# Patient Record
Sex: Female | Born: 1995 | Race: White | Hispanic: No | Marital: Married | Smoking: Never smoker
Health system: Southern US, Community
[De-identification: ages and names within clinical notes are randomized; demographics above are authoritative.]

## PROBLEM LIST (undated history)

## (undated) DIAGNOSIS — F329 Major depressive disorder, single episode, unspecified: Secondary | ICD-10-CM

## (undated) DIAGNOSIS — K219 Gastro-esophageal reflux disease without esophagitis: Secondary | ICD-10-CM

## (undated) DIAGNOSIS — F32A Depression, unspecified: Secondary | ICD-10-CM

## (undated) DIAGNOSIS — F419 Anxiety disorder, unspecified: Secondary | ICD-10-CM

## (undated) DIAGNOSIS — R630 Anorexia: Secondary | ICD-10-CM

## (undated) DIAGNOSIS — E282 Polycystic ovarian syndrome: Secondary | ICD-10-CM

## (undated) HISTORY — DX: Depression, unspecified: F32.A

## (undated) HISTORY — DX: Anxiety disorder, unspecified: F41.9

## (undated) HISTORY — DX: Gastro-esophageal reflux disease without esophagitis: K21.9

## (undated) HISTORY — DX: Anorexia: R63.0

---
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
## 2014-01-13 ENCOUNTER — Encounter: Payer: Self-pay | Admitting: Nurse Practitioner

## 2014-01-13 ENCOUNTER — Ambulatory Visit (INDEPENDENT_AMBULATORY_CARE_PROVIDER_SITE_OTHER): Payer: 59 | Admitting: Nurse Practitioner

## 2014-01-13 VITALS — BP 110/78 | HR 70 | Temp 98.7°F | Ht 66.75 in | Wt 138.0 lb

## 2014-01-13 DIAGNOSIS — Z23 Encounter for immunization: Secondary | ICD-10-CM

## 2014-01-13 DIAGNOSIS — Z0000 Encounter for general adult medical examination without abnormal findings: Secondary | ICD-10-CM

## 2014-01-13 MED ORDER — NORGESTIMATE-ETH ESTRADIOL 0.25-35 MG-MCG PO TABS: 1.0000 | ORAL_TABLET | Freq: Every day | ORAL | Status: DC

## 2014-01-16 ENCOUNTER — Encounter: Payer: Self-pay | Admitting: Nurse Practitioner

## 2014-01-16 NOTE — Progress Notes (Signed)
   Subjective:    Patient ID: Rachel Logan, female    DOB: 11-26-1995, 18 y.o.   MRN: [id number]  HPI presents for her wellness exam. Same sexual partner for the past 2 years. Had a normal menstrual cycle about 2 weeks ago. Stopped her birth control pills in the middle of last pack of a 3 month pack 2 weeks ago. Has had unprotected sex. Regular vision and dental exams. Currently in school. Limited activity. Has not done well with her diet lately. Also upper abdominal pain over the past month, mainly occurs at night, rarely during the day. No heartburn or reflux symptoms. Pain is 5 out of 10 on a pain scale. Has not identified any specific triggers.    Review of Systems  Constitutional: Negative for fever, activity change, appetite change and fatigue.  HENT: Negative for dental problem, ear pain, sinus pressure and sore throat.   Respiratory: Negative for cough, chest tightness, shortness of breath and wheezing.   Cardiovascular: Negative for chest pain.  Gastrointestinal: Positive for abdominal pain. Negative for nausea, vomiting, diarrhea, constipation and abdominal distention.  Genitourinary: Negative for dysuria, urgency, frequency, vaginal discharge, enuresis, difficulty urinating, genital sores, menstrual problem and pelvic pain.       Objective:   Physical Exam  Constitutional: She is oriented to person, place, and time. She appears well-developed. No distress.  HENT:  Right Ear: External ear normal.  Left Ear: External ear normal.  Mouth/Throat: Oropharynx is clear and moist.  Neck: Normal range of motion. Neck supple. No tracheal deviation present. No thyromegaly present.  Cardiovascular: Normal rate, regular rhythm and normal heart sounds.  Exam reveals no gallop.   No murmur heard. Pulmonary/Chest: Effort normal and breath sounds normal.  Abdominal: Soft. She exhibits no distension and no mass. There is no tenderness. There is no rebound and no guarding.  Genitourinary:    Defers GU exam, denies any problems. Had complete STD workup in June which was negative. Defers further workup at this time.  Musculoskeletal: She exhibits no edema.  Lymphadenopathy:    She has no cervical adenopathy.  Neurological: She is alert and oriented to person, place, and time.  Skin: Skin is warm and dry. No rash noted.  Psychiatric: She has a normal mood and affect. Her behavior is normal.  Vitals reviewed. Breast exam: Dense tissue with fine nodularity, no dominant masses. Axilla no adenopathy.        Assessment & Plan:  Routine general medical examination at a health care facility  Need for prophylactic vaccination and inoculation against influenza - Plan: Flu Vaccine QUAD 36+ mos PF IM (Fluarix Quad PF)    Meds ordered this encounter  Medications  . norgestimate-ethinyl estradiol (ORTHO-CYCLEN,SPRINTEC,PREVIFEM) 0.25-35 MG-MCG tablet    Sig: Take 1 tablet by mouth daily.    Dispense:  1 Package    Refill:  11    Order Specific Question:  Supervising Provider    Answer:  Maggie Font   Start control pills first Sunday after her next normal cycle begins. Recommend using condoms as backup method until that time. If no cycle within the next 3 weeks, patient to call for pregnancy testing. Recommend healthy diet and regular activity. Discussed safe sex issues. Recommend trial of OTC med for possible reflux, call back if abdominal symptoms persist. Return in about 1 year (around 01/14/2015).

## 2014-08-06 HISTORY — PX: OTHER SURGICAL HISTORY: SHX169

## 2014-08-20 ENCOUNTER — Encounter: Payer: Self-pay | Admitting: Nurse Practitioner

## 2014-08-20 ENCOUNTER — Ambulatory Visit (INDEPENDENT_AMBULATORY_CARE_PROVIDER_SITE_OTHER): Payer: BC Managed Care – PPO | Admitting: Nurse Practitioner

## 2014-08-20 VITALS — BP 112/80 | Ht 67.0 in | Wt 143.0 lb

## 2014-08-20 DIAGNOSIS — N912 Amenorrhea, unspecified: Secondary | ICD-10-CM

## 2014-08-20 DIAGNOSIS — Z113 Encounter for screening for infections with a predominantly sexual mode of transmission: Secondary | ICD-10-CM | POA: Diagnosis not present

## 2014-08-20 DIAGNOSIS — Z3009 Encounter for other general counseling and advice on contraception: Secondary | ICD-10-CM | POA: Diagnosis not present

## 2014-08-20 DIAGNOSIS — K2100 Gastro-esophageal reflux disease with esophagitis, without bleeding: Secondary | ICD-10-CM

## 2014-08-20 MED ORDER — NORETHINDRONE ACET-ETHINYL EST 1-20 MG-MCG PO TABS: 1.0000 | ORAL_TABLET | Freq: Every day | ORAL | Status: DC

## 2014-08-20 MED ORDER — OMEPRAZOLE 40 MG PO CPDR: 40.0000 mg | DELAYED_RELEASE_CAPSULE | Freq: Every day | ORAL | Status: DC

## 2014-08-20 NOTE — Patient Instructions (Signed)

## 2014-08-22 ENCOUNTER — Encounter: Payer: Self-pay | Admitting: Nurse Practitioner

## 2014-08-22 DIAGNOSIS — K2100 Gastro-esophageal reflux disease with esophagitis, without bleeding: Secondary | ICD-10-CM | POA: Insufficient documentation

## 2014-08-22 LAB — GC/CHLAMYDIA PROBE AMP
Chlamydia trachomatis, NAA: NEGATIVE
Neisseria gonorrhoeae by PCR: NEGATIVE

## 2014-08-22 LAB — POCT URINE PREGNANCY: Preg Test, Ur: NEGATIVE

## 2014-08-22 NOTE — Progress Notes (Signed)
Subjective:  Presents with complaints of burning in the lower area of the chest no the sternum over the past week. Localized. Worse after eating and drinking, unassociated with any particular foods. No shortness of breath. Unassociated with activity. Nonsmoker. No alcohol use. No excessive NSAID use. Has tried Aleve for the pain with no relief. No nausea vomiting. Had diarrhea about a week ago which has resolved. No constipation. No blood in her stools. No change in the color of her stools. No abdominal pain. No fever. Also complaints of no cycle since the end of January. Stop her birth control by mouth around late November. Last intercourse was about 3 weeks ago. Had a negative pregnancy test at home. Was seen for plastic surgery about a week ago, pregnancy test there was also negative.  Objective:   BP 112/80 mmHg  Ht 5\' 7"  (1.702 m)  Wt 143 lb (64.864 kg)  BMI 22.39 kg/m2  LMP 04/11/2014 NAD. Alert, oriented. Lungs clear. Heart regular rate rhythm. Abdomen soft nondistended with mild epigastric area discomfort on exam. No rebound or guarding. No obvious masses. Urine hCG negative.    Assessment:  Problem List Items Addressed This Visit      Digestive   Gastroesophageal reflux disease with esophagitis - Primary    Other Visit Diagnoses    Amenorrhea        Relevant Orders    POCT urine pregnancy    Encounter for other general counseling or advice on contraception        Screen for STD (sexually transmitted disease)        Relevant Orders    GC/Chlamydia Probe Amp      Plan: Discussed contraceptive options. Patient wishes to try a different low-dose birth control pill. Had weight gain on previous pill. Discussed safe sex issues. Routine screening for gonorrhea and Chlamydia. Start omeprazole as directed daily. Call back in 2 weeks if symptoms persist. Given information on dietary measures affecting reflux. Return in about 1 month (around 09/19/2014) for recheck on reflux.

## 2014-09-18 ENCOUNTER — Ambulatory Visit (INDEPENDENT_AMBULATORY_CARE_PROVIDER_SITE_OTHER): Payer: BC Managed Care – PPO | Admitting: Nurse Practitioner

## 2014-09-18 ENCOUNTER — Encounter: Payer: Self-pay | Admitting: Nurse Practitioner

## 2014-09-18 VITALS — BP 120/78 | Ht 67.0 in | Wt 145.8 lb

## 2014-09-18 DIAGNOSIS — K2100 Gastro-esophageal reflux disease with esophagitis, without bleeding: Secondary | ICD-10-CM

## 2014-09-19 ENCOUNTER — Encounter: Payer: Self-pay | Admitting: Nurse Practitioner

## 2014-09-19 NOTE — Progress Notes (Signed)
Subjective:  Presents for recheck on her acid reflux. Took omeprazole 40 mg 1 dose, symptoms have now resolved. Bowels normal limit. Had a normal menstrual cycle at the end of June. Went on vacation and left her birth control pills at home, was 1 week late restarting these. Has not had intercourse for several weeks due to labioplasty. Did not have intercourse during the time she was off her pills.  Objective:   BP 120/78 mmHg  Ht 5\' 7"  (1.702 m)  Wt 145 lb 12.8 oz (66.134 kg)  BMI 22.83 kg/m2  LMP 04/11/2014 NAD. Alert, oriented. Lungs clear. Heart regular rate rhythm. Abdomen soft nontender. External GU well-healed, no signs of infection.  Assessment:  Problem List Items Addressed This Visit      Digestive   RESOLVED: Gastroesophageal reflux disease with esophagitis - Primary     Plan: Use omeprazole on a when necessary basis. Patient understands that she may have breakthrough bleeding or irregular cycles until she is regulated again with birth control pills. Call back if any problems.

## 2015-03-13 ENCOUNTER — Encounter: Payer: Self-pay | Admitting: Family Medicine

## 2015-03-13 ENCOUNTER — Ambulatory Visit (INDEPENDENT_AMBULATORY_CARE_PROVIDER_SITE_OTHER): Payer: BLUE CROSS/BLUE SHIELD | Admitting: Family Medicine

## 2015-03-13 VITALS — BP 118/70 | Temp 98.3°F | Ht 67.0 in | Wt 148.0 lb

## 2015-03-13 DIAGNOSIS — J329 Chronic sinusitis, unspecified: Secondary | ICD-10-CM | POA: Diagnosis not present

## 2015-03-13 MED ORDER — CEFPROZIL 500 MG PO TABS: 500.0000 mg | ORAL_TABLET | Freq: Two times a day (BID) | ORAL | Status: DC

## 2015-03-13 NOTE — Progress Notes (Signed)
   Subjective:    Patient ID: Rachel Logan, female    DOB: [birth year], 20 y.o.   MRN: [id number]  Sore Throat  This is a new problem. Episode onset: 5 days ago. Associated symptoms include coughing, ear pain and headaches. Associated symptoms comments: Runny nose. Treatments tried: mucinex, dayquil, nyquil.   Ha fever and chills  achey all over stuck six days ago  Tried dayquil then nyquil waking up  Some prod   Non smker  Blood gunky   Some exposures, [place] jewelry Left frontal   Review of Systems  HENT: Positive for ear pain.   Respiratory: Positive for cough.   Neurological: Positive for headaches.       Objective:   Physical Exam Alert mild malaise. Hydration good. Vitals stable. HEENT frontal maxillary tenderness pharynx normal neck supple lungs bronchial cough heart regular in rhythm.       Assessment & Plan:  Impression rhinosinusitis likely post viral, discussed with patient. plan antibiotics prescribed. Questions answered. Symptomatic care discussed. warning signs discussed. WSL

## 2015-11-04 ENCOUNTER — Encounter: Payer: Self-pay | Admitting: Family Medicine

## 2015-11-04 ENCOUNTER — Ambulatory Visit (INDEPENDENT_AMBULATORY_CARE_PROVIDER_SITE_OTHER): Payer: BLUE CROSS/BLUE SHIELD | Admitting: Family Medicine

## 2015-11-04 VITALS — BP 110/76 | Temp 98.1°F | Ht 67.0 in | Wt 148.1 lb

## 2015-11-04 DIAGNOSIS — K208 Other esophagitis without bleeding: Secondary | ICD-10-CM

## 2015-11-04 MED ORDER — OMEPRAZOLE 40 MG PO CPDR: 40.0000 mg | DELAYED_RELEASE_CAPSULE | Freq: Every day | ORAL | 1 refills | Status: DC

## 2015-11-04 NOTE — Progress Notes (Signed)
   Subjective:    Patient ID: Rachel Logan, female    DOB: 12/20/1995, 20 y.o.   MRN: [id number]  Gastroesophageal Reflux  She complains of chest pain and heartburn. She reports no abdominal pain, no coughing or no nausea. This is a recurrent problem. The symptoms are aggravated by lying down and certain foods. Pertinent negatives include no fatigue.   Patient states no other concerns this visit.  Patient relates burning when she swallows pain and discomfort when she swallows been present over the past 3-4 days she denies any excessive use of caffeine chocolates or tomato based products. She denies any abdominal pain. No regurgitation in the back of her mouth. She does relate though that she's been on doxycycline over the past several weeks takes it near bedtime without food and only a small amount of water in just recently started noticing this problem  Review of Systems  Constitutional: Negative for fatigue and fever.  HENT: Negative for congestion.   Respiratory: Negative for cough and shortness of breath.   Cardiovascular: Positive for chest pain.  Gastrointestinal: Positive for heartburn. Negative for abdominal pain and nausea.       Objective:   Physical Exam  Constitutional: She appears well-nourished. No distress.  HENT:  Head: Normocephalic.  Cardiovascular: Normal rate, regular rhythm and normal heart sounds.   No murmur heard. Pulmonary/Chest: Effort normal and breath sounds normal.  Abdominal: Soft. She exhibits no distension. There is no tenderness. There is no rebound.  Musculoskeletal: She exhibits no edema.  Lymphadenopathy:    She has no cervical adenopathy.  Neurological: She is alert.  Psychiatric: Her behavior is normal.  Vitals reviewed.   Pathophysiology of doxycycline pill esophagitis was discussed in detail      Assessment & Plan:  Pill esophagitis-proper way to take medicine discuss the next several weeks I would hold off on the doxycycline. I  would recommend taking acid blocker avoiding any foods that trigger acid reflux if ongoing troubles to notify us. The symptoms should get better over the next 2 weeks then after that over the course of the next 3-4 weeks stay off the doxycycline then can reinitiate with food and a tall glass of water in if it still triggers a problem again she should stop this medicine and discuss with dermatology to get the medicine change she should also follow-up with [place] if any ongoing troubles

## 2016-01-26 ENCOUNTER — Encounter: Payer: Self-pay | Admitting: Nurse Practitioner

## 2016-01-26 ENCOUNTER — Ambulatory Visit (INDEPENDENT_AMBULATORY_CARE_PROVIDER_SITE_OTHER): Payer: BLUE CROSS/BLUE SHIELD | Admitting: Nurse Practitioner

## 2016-01-26 VITALS — BP 132/70 | Wt 148.4 lb

## 2016-01-26 DIAGNOSIS — R102 Pelvic and perineal pain: Secondary | ICD-10-CM | POA: Diagnosis not present

## 2016-01-26 DIAGNOSIS — N912 Amenorrhea, unspecified: Secondary | ICD-10-CM

## 2016-01-26 DIAGNOSIS — Z113 Encounter for screening for infections with a predominantly sexual mode of transmission: Secondary | ICD-10-CM | POA: Diagnosis not present

## 2016-01-26 DIAGNOSIS — B373 Candidiasis of vulva and vagina: Secondary | ICD-10-CM | POA: Diagnosis not present

## 2016-01-26 DIAGNOSIS — B3731 Acute candidiasis of vulva and vagina: Secondary | ICD-10-CM

## 2016-01-26 MED ORDER — NORGESTIMATE-ETH ESTRADIOL 0.25-35 MG-MCG PO TABS: 1.0000 | ORAL_TABLET | Freq: Every day | ORAL | 11 refills | Status: DC

## 2016-01-26 MED ORDER — FLUCONAZOLE 150 MG PO TABS: ORAL_TABLET | ORAL | 0 refills | Status: DC

## 2016-01-27 LAB — HCG, SERUM, QUALITATIVE: hCG,Beta Subunit,Qual,Serum: NEGATIVE m[IU]/mL (ref <6)

## 2016-01-27 LAB — CHLAMYDIA/GONOCOCCUS/TRICHOMONAS, NAA
Chlamydia by NAA: NEGATIVE
Gonococcus by NAA: NEGATIVE
Trich vag by NAA: NEGATIVE

## 2016-01-28 ENCOUNTER — Encounter: Payer: Self-pay | Admitting: Nurse Practitioner

## 2016-01-28 NOTE — Progress Notes (Signed)
Subjective:  Presents for c/o no menses since 11/06/15. Has been off birth control pills for several months. Had decreased sex drive and weight gain. Does not use condoms, uses withdrawal method. Has taken 2 pregnancy tests which were negative. Last intercourse late October. Increased discharge at times mainly white to clear. Slight color only once. No fever, pelvic pain, odor, itching or dyspareunia. No urinary symptoms.   Objective:   BP 132/70   Wt 148 lb 6.4 oz (67.3 kg)   BMI 23.24 kg/m  NAD. Alert, oriented. Lungs clear. Heart RRR. Abdomen soft, non tender. External GU: no rashes or lesions. Vagina: small amount thick mucoid discharge. No CMT. Bimanual exam: no tenderness or obvious masses. Wet prep: ph 4.5.  Assessment: Pelvic pain - Plan: Chlamydia/Gonococcus/Trichomonas, NAA  Screen for STD (sexually transmitted disease) - Plan: Chlamydia/Gonococcus/Trichomonas, NAA  Amenorrhea - Plan: hCG, serum, qualitative  Vaginal candidiasis   Plan:  Meds ordered this encounter  Medications  . norgestimate-ethinyl estradiol (ORTHO-CYCLEN,SPRINTEC,PREVIFEM) 0.25-35 MG-MCG tablet    Sig: Take 1 tablet by mouth daily.    Dispense:  1 Package    Refill:  11    Order Specific Question:   Supervising Provider    Answer:   Mikey Kirschner [[id number]]  . fluconazole (DIFLUCAN) 150 MG tablet    Sig: One po qd prn yeast infection; may repeat in 3-4 days if needed    Dispense:  2 tablet    Refill:  0    Order Specific Question:   Supervising Provider    Answer:   Mikey Kirschner [[id number]]   Serum Hcg neg. Start new pill this Sunday; use back up method first pack. Discussed safe sex issues. Has completed HPV vaccine series.  Return if symptoms worsen or fail to improve.

## 2016-12-28 ENCOUNTER — Ambulatory Visit (INDEPENDENT_AMBULATORY_CARE_PROVIDER_SITE_OTHER): Payer: Commercial Managed Care - PPO | Admitting: Nurse Practitioner

## 2016-12-28 ENCOUNTER — Encounter: Payer: Self-pay | Admitting: Nurse Practitioner

## 2016-12-28 VITALS — BP 110/82 | Temp 97.9°F | Ht 67.0 in | Wt 146.5 lb

## 2016-12-28 DIAGNOSIS — B9689 Other specified bacterial agents as the cause of diseases classified elsewhere: Secondary | ICD-10-CM

## 2016-12-28 DIAGNOSIS — J069 Acute upper respiratory infection, unspecified: Secondary | ICD-10-CM | POA: Diagnosis not present

## 2016-12-28 DIAGNOSIS — J029 Acute pharyngitis, unspecified: Secondary | ICD-10-CM

## 2016-12-28 LAB — POCT RAPID STREP A (OFFICE): Rapid Strep A Screen: NEGATIVE

## 2016-12-28 MED ORDER — AZITHROMYCIN 250 MG PO TABS: ORAL_TABLET | ORAL | 0 refills | Status: DC

## 2016-12-28 NOTE — Progress Notes (Signed)
Subjective: Presents for complaints of sore throat and ear pain for the past 3 weeks.  No fever.  No headache.  Producing dark green nasal drainage, slight bleeding at times.  Cough worse at nighttime.  No wheezing.  Left ear pain.  Objective:   BP 110/82   Temp 97.9 F (36.6 C) (Oral)   Ht 5\' 7"  (1.702 m)   Wt 146 lb 8 oz (66.5 kg)   BMI 22.95 kg/m  NAD.  Alert, oriented.  TMs retracted bilaterally, no erythema.  Pharynx injected with green PND noted.  Neck supple with mild soft anterior adenopathy.  Lungs clear.  Heart regular rate rhythm. Results for orders placed or performed in visit on 12/28/16  POCT rapid strep A  Result Value Ref Range   Rapid Strep A Screen Negative Negative     Assessment:  Bacterial upper respiratory infection  Sore throat - Plan: POCT rapid strep A, CANCELED: Strep A DNA probe    Plan:   Meds ordered this encounter  Medications  . tretinoin (RETIN-A) 0.05 % cream    Sig: APPLY TO THE AFFECTED AREA ON THE FACE AT BEDTIME.    Refill:  1  . azithromycin (ZITHROMAX Z-PAK) 250 MG tablet    Sig: Take 2 tablets (500 mg) on  Day 1,  followed by 1 tablet (250 mg) once daily on Days 2 through 5.    Dispense:  6 each    Refill:  0    Order Specific Question:   Supervising Provider    Answer:   Mikey Kirschner [[id number]]   OTC meds as directed for congestion and cough.  Call back next week if no improvement, sooner if worse.  Warning signs were reviewed.

## 2017-01-18 ENCOUNTER — Ambulatory Visit (INDEPENDENT_AMBULATORY_CARE_PROVIDER_SITE_OTHER): Payer: Commercial Managed Care - PPO | Admitting: Nurse Practitioner

## 2017-01-18 ENCOUNTER — Encounter: Payer: Self-pay | Admitting: Nurse Practitioner

## 2017-01-18 DIAGNOSIS — Z23 Encounter for immunization: Secondary | ICD-10-CM | POA: Diagnosis not present

## 2017-01-18 DIAGNOSIS — F329 Major depressive disorder, single episode, unspecified: Secondary | ICD-10-CM | POA: Diagnosis not present

## 2017-01-18 DIAGNOSIS — F419 Anxiety disorder, unspecified: Secondary | ICD-10-CM | POA: Insufficient documentation

## 2017-01-18 DIAGNOSIS — Z111 Encounter for screening for respiratory tuberculosis: Secondary | ICD-10-CM | POA: Diagnosis not present

## 2017-01-18 MED ORDER — ESCITALOPRAM OXALATE 10 MG PO TABS: 10.0000 mg | ORAL_TABLET | Freq: Every day | ORAL | 2 refills | Status: DC

## 2017-01-18 NOTE — Progress Notes (Signed)
Subjective: Presents to discuss her anxiety and depression symptoms.  Overall sleeping well at nighttime.  Denies any suicidal or homicidal thoughts or ideation.  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  Would like to avoid using controlled substances. Depression screen PHQ 2/9 01/18/2017  Decreased Interest 2  Down, Depressed, Hopeless 1  PHQ - 2 Score 3  Altered sleeping 3  Tired, decreased energy 3  Change in appetite 2  Feeling bad or failure about yourself  2  Trouble concentrating 0  Moving slowly or fidgety/restless 0  Suicidal thoughts 0  PHQ-9 Score 13  Difficult doing work/chores Somewhat difficult   GAD 7 : Generalized Anxiety Score 01/18/2017  Nervous, Anxious, on Edge 3  Control/stop worrying 3  Worry too much - different things 3  Trouble relaxing 2  Restless 1  Easily annoyed or irritable 3  Afraid - awful might happen 0  Total GAD 7 Score 15  Anxiety Difficulty Somewhat difficult      Objective:   There were no vitals taken for this visit. NAD.  Alert, oriented.  Thoughts logical coherent and relevant.  Dressed appropriately.  Making good eye contact.  Lungs clear.  Heart regular rate and rhythm.  Assessment:  Anxiety and depression  Screening for tuberculosis - Plan: TB Skin Test  Need for vaccination - Plan: Flu Vaccine QUAD 6+ mos PF IM (Fluarix Quad PF)    Plan:   Meds ordered this encounter  Medications  . escitalopram (LEXAPRO) 10 MG tablet    Sig: Take 1 tablet (10 mg total) daily by mouth.    Dispense:  30 tablet    Refill:  2    Order Specific Question:   Supervising Provider    Answer:   Mikey Kirschner [[id number]]   TB skin test today per school requirement.  Start Lexapro 10 mg 1 p.o. daily.  Reviewed potential adverse effects.  DC med and call if any problems.  Otherwise follow-up first week of December for her wellness exam and recheck on Lexapro use.  Call back sooner if any problems.

## 2017-01-20 LAB — TB SKIN TEST
Induration: 0 mm
TB Skin Test: NEGATIVE

## 2017-02-07 ENCOUNTER — Ambulatory Visit (INDEPENDENT_AMBULATORY_CARE_PROVIDER_SITE_OTHER): Payer: Commercial Managed Care - PPO | Admitting: Nurse Practitioner

## 2017-02-07 ENCOUNTER — Encounter: Payer: Self-pay | Admitting: Nurse Practitioner

## 2017-02-07 VITALS — BP 112/74 | Ht 67.0 in | Wt 148.8 lb

## 2017-02-07 DIAGNOSIS — Z1151 Encounter for screening for human papillomavirus (HPV): Secondary | ICD-10-CM | POA: Diagnosis not present

## 2017-02-07 DIAGNOSIS — R3915 Urgency of urination: Secondary | ICD-10-CM

## 2017-02-07 DIAGNOSIS — Z111 Encounter for screening for respiratory tuberculosis: Secondary | ICD-10-CM

## 2017-02-07 DIAGNOSIS — Z01419 Encounter for gynecological examination (general) (routine) without abnormal findings: Secondary | ICD-10-CM | POA: Diagnosis not present

## 2017-02-07 DIAGNOSIS — Z124 Encounter for screening for malignant neoplasm of cervix: Secondary | ICD-10-CM | POA: Diagnosis not present

## 2017-02-07 LAB — POCT URINALYSIS DIPSTICK
Spec Grav, UA: 1.02 (ref 1.010–1.025)
pH, UA: 6 (ref 5.0–8.0)

## 2017-02-07 NOTE — Progress Notes (Signed)
Subjective:    Patient ID: Rachel Logan, female    DOB: 05/19/1995, 21 y.o.   MRN: [id number]  HPI presents for her wellness exam. Not sexually active at this time. Is with the same partner but decided to not have a sexual relationship for religious reasons. Off contraceptive. Mostly regular cycles, normal flow. Regular vision exams. Needs dental care. Was very active working out at one point but not for the past 2 months due to school and work. Has taken on more at work to make money this month. 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 Lexapro has helped some but having overwhelming stress this month. Denies any adverse effects. No suicidal or homicidal thoughts or ideation.     Review of Systems  Constitutional: Positive for fatigue. Negative for activity change and appetite change.  HENT: Negative for dental problem, ear pain, sinus pressure and sore throat.   Respiratory: Negative for cough, chest tightness, shortness of breath and wheezing.   Cardiovascular: Negative for chest pain.  Gastrointestinal: Negative for abdominal distention, abdominal pain, constipation, diarrhea, nausea and vomiting.  Genitourinary: Positive for dyspareunia and urgency. Negative for difficulty urinating, dysuria, enuresis, frequency, genital sores, menstrual problem, pelvic pain and vaginal discharge.       Having urinary urgency. No incontinence. Also was having occasional pain during intercourse.   Psychiatric/Behavioral: Positive for sleep disturbance. Negative for suicidal ideas. The patient is nervous/anxious.        Objective:   Physical Exam  Constitutional: She is oriented to person, place, and time. She appears well-developed. No distress.  HENT:  Right Ear: External ear normal.  Left Ear: External ear normal.  Mouth/Throat: Oropharynx is clear and moist.  Neck: Normal range of motion. Neck supple. No tracheal deviation present. No thyromegaly present.  Cardiovascular:  Normal rate, regular rhythm and normal heart sounds. Exam reveals no gallop.  No murmur heard. Pulmonary/Chest: Effort normal and breath sounds normal. Right breast exhibits no inverted nipple, no mass, no skin change and no tenderness. Left breast exhibits no inverted nipple, no mass, no skin change and no tenderness. Breasts are symmetrical.  Axillae no adenopathy.   Abdominal: Soft. She exhibits no distension. There is no tenderness.  Genitourinary: Vagina normal and uterus normal. No vaginal discharge found.  Genitourinary Comments: External GU: no rashes or lesions noted. Vagina: mild white mucoid discharge. Cervix normal in appearance. No CMT. Bimanual exam: mild tenderness along anterior vaginal wall with speculum insertion. Bimanual exam: no adnexal tenderness or masses.   Musculoskeletal: She exhibits no edema.  Lymphadenopathy:    She has no cervical adenopathy.  Neurological: She is alert and oriented to person, place, and time.  Skin: Skin is warm and dry. No rash noted.  Psychiatric: She has a normal mood and affect. Her behavior is normal.  Vitals reviewed.  Results for orders placed or performed in visit on 02/07/17  POCT urinalysis dipstick  Result Value Ref Range   Color, UA     Clarity, UA     Glucose, UA     Bilirubin, UA     Ketones, UA     Spec Grav, UA 1.020 1.010 - 1.025   Blood, UA     pH, UA 6.0 5.0 - 8.0   Protein, UA     Urobilinogen, UA  0.2 or 1.0 E.U./dL   Nitrite, UA     Leukocytes, UA  Negative           Assessment &  Plan:  Well woman exam - Plan: Pap IG and Chlamydia/Gonococcus, NAA  Urinary urgency - Plan: POCT urinalysis dipstick  Screening for cervical cancer - Plan: Pap IG and Chlamydia/Gonococcus, NAA  Screening for HPV (human papillomavirus) - Plan: Pap IG and Chlamydia/Gonococcus, NAA  Screening-pulmonary TB - Plan: TB Skin Test  Recommend urology referral to evaluate for possible interstitial cystitis. Defers for now. Discussed  importance of stress reduction, healthy diet and resuming exercise.  Return in about 1 year (around 02/07/2018) for physical.

## 2017-02-09 ENCOUNTER — Telehealth: Payer: Self-pay | Admitting: Family Medicine

## 2017-02-09 LAB — PAP IG AND CT-NG NAA
Chlamydia, Nuc. Acid Amp: NEGATIVE
Gonococcus by Nucleic Acid Amp: NEGATIVE
PAP Smear Comment: 0

## 2017-02-09 LAB — TB SKIN TEST
Induration: 0 mm
TB Skin Test: NEGATIVE

## 2017-02-09 NOTE — Telephone Encounter (Signed)
Pt dropped off a physical form to be filled out by [employer]. Form is in nurse box.

## 2017-02-10 NOTE — Telephone Encounter (Signed)
Done and given to front desk.

## 2017-03-30 ENCOUNTER — Ambulatory Visit (INDEPENDENT_AMBULATORY_CARE_PROVIDER_SITE_OTHER): Payer: Commercial Managed Care - PPO | Admitting: Nurse Practitioner

## 2017-03-30 ENCOUNTER — Encounter: Payer: Self-pay | Admitting: Nurse Practitioner

## 2017-03-30 VITALS — BP 112/76 | Ht 67.0 in | Wt 149.1 lb

## 2017-03-30 DIAGNOSIS — F329 Major depressive disorder, single episode, unspecified: Secondary | ICD-10-CM

## 2017-03-30 DIAGNOSIS — F32A Depression, unspecified: Secondary | ICD-10-CM

## 2017-03-30 DIAGNOSIS — F411 Generalized anxiety disorder: Secondary | ICD-10-CM

## 2017-03-30 DIAGNOSIS — F419 Anxiety disorder, unspecified: Secondary | ICD-10-CM

## 2017-03-30 DIAGNOSIS — F430 Acute stress reaction: Secondary | ICD-10-CM

## 2017-03-30 MED ORDER — ESCITALOPRAM OXALATE 20 MG PO TABS: 20.0000 mg | ORAL_TABLET | Freq: Every day | ORAL | 2 refills | Status: DC

## 2017-03-30 MED ORDER — CLONAZEPAM 0.5 MG PO TABS: ORAL_TABLET | ORAL | 0 refills | Status: DC

## 2017-03-30 NOTE — Patient Instructions (Signed)
[facility]  [email or web address]

## 2017-04-01 ENCOUNTER — Encounter: Payer: Self-pay | Admitting: Nurse Practitioner

## 2017-04-01 NOTE — Progress Notes (Signed)
Subjective: Presents for recheck on her anxiety and depression.  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  Now having generalized anxiety with some difficulty such as driving in traffic.  Denies suicidal or homicidal thoughts or ideation. GAD 7 : Generalized Anxiety Score 03/30/2017 01/18/2017  Nervous, Anxious, on Edge 3 3  Control/stop worrying 3 3  Worry too much - different things 3 3  Trouble relaxing 3 2  Restless 3 1  Easily annoyed or irritable 3 3  Afraid - awful might happen 3 0  Total GAD 7 Score 21 15  Anxiety Difficulty Somewhat difficult Somewhat difficult      Objective:   BP 112/76   Ht 5\' 7"  (1.702 m)   Wt 149 lb 0.8 oz (67.6 kg)   BMI 23.34 kg/m  NAD.  Alert, oriented.  Lungs clear.  Heart regular rate and rhythm.  Thoughts logical coherent and relevant.  Crying at times during office visit.  Dressed appropriately.  Making good eye contact.  Assessment:   Problem List Items Addressed This Visit      Other   Anxiety and depression - Primary   Relevant Medications   escitalopram (LEXAPRO) 20 MG tablet    Other Visit Diagnoses    Anxiety as acute reaction to exceptional stress       Relevant Medications   escitalopram (LEXAPRO) 20 MG tablet        Plan:   Meds ordered this encounter  Medications  . escitalopram (LEXAPRO) 20 MG tablet    Sig: Take 1 tablet (20 mg total) by mouth daily.    Dispense:  30 tablet    Refill:  2    Order Specific Question:   Supervising Provider    Answer:   Mikey Kirschner [[id number]]  . clonazePAM (KLONOPIN) 0.5 MG tablet    Sig: Take 1/2-1 tab po BID prn severe anxiety    Dispense:  30 tablet    Refill:  0    Order Specific Question:   Supervising Provider    Answer:   Mikey Kirschner [[id number]]   Increase Lexapro to 20 mg.  Given prescription for Klonopin to use sparingly only for extreme anxiety.  Drowsiness precautions.  Advised patient not to take Klonopin if she becomes pregnant.  Encourage patient  to seek mental health counseling which she defers at this time. Return in about 2 months (around 05/28/2017). Call back sooner if no improvement or worsening symptoms.

## 2017-05-29 ENCOUNTER — Encounter: Payer: Self-pay | Admitting: Nurse Practitioner

## 2017-05-29 ENCOUNTER — Ambulatory Visit (INDEPENDENT_AMBULATORY_CARE_PROVIDER_SITE_OTHER): Payer: Commercial Managed Care - PPO | Admitting: Nurse Practitioner

## 2017-05-29 VITALS — BP 124/76 | Ht 67.0 in | Wt 152.4 lb

## 2017-05-29 DIAGNOSIS — F329 Major depressive disorder, single episode, unspecified: Secondary | ICD-10-CM | POA: Diagnosis not present

## 2017-05-29 DIAGNOSIS — F419 Anxiety disorder, unspecified: Secondary | ICD-10-CM

## 2017-05-29 NOTE — Progress Notes (Signed)
Subjective:  Presents for recheck on anxiety and depression. Doing well overall on Lexapro. Has taken a total of 3 Klonopin with minimal improvement during testing.This is the main area she is still struggling in.  Continues to experience test anxiety. Second guessing herself. 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 Denies suicidal or homicidal thoughts or ideation.   Objective:   BP 124/76   Ht 5\' 7"  (1.702 m)   Wt 152 lb 6.4 oz (69.1 kg)   BMI 23.87 kg/m  NAD. Alert, oriented. Lungs clear. Heart RRR. Thoughts logical, coherent and relevant. Dressed appropriately. Mildly anxious affect. Making good eye contact.   Assessment:   Problem List Items Addressed This Visit      Other   Anxiety and depression - Primary       Plan:  Continue Lexapro as directed. Use Klonopin sparingly. Recommend that she go to counseling center at [facility] for assistance with test taking skills and test anxiety. Patient agrees with this plan. Return in about 3 months (around 08/29/2017) for recheck. Call back sooner if needed.

## 2017-08-28 ENCOUNTER — Other Ambulatory Visit: Payer: Self-pay | Admitting: Nurse Practitioner

## 2017-08-28 ENCOUNTER — Ambulatory Visit (INDEPENDENT_AMBULATORY_CARE_PROVIDER_SITE_OTHER): Payer: Commercial Managed Care - PPO | Admitting: Nurse Practitioner

## 2017-08-28 ENCOUNTER — Encounter: Payer: Self-pay | Admitting: Nurse Practitioner

## 2017-08-28 VITALS — BP 110/72 | Ht 67.0 in | Wt 149.0 lb

## 2017-08-28 DIAGNOSIS — F329 Major depressive disorder, single episode, unspecified: Secondary | ICD-10-CM | POA: Diagnosis not present

## 2017-08-28 DIAGNOSIS — F419 Anxiety disorder, unspecified: Secondary | ICD-10-CM | POA: Diagnosis not present

## 2017-08-28 MED ORDER — BUPROPION HCL ER (XL) 150 MG PO TB24: 150.0000 mg | ORAL_TABLET | Freq: Every day | ORAL | 2 refills | Status: DC

## 2017-08-28 NOTE — Progress Notes (Signed)
Subjective:  Presents for recheck of depression and anxiety. Currently on Lexapro 20 mg daily. Stopped for awhile thinking it may have caused some restless legs but no change so she has been back on medication for about a month. Feeling good today but continue to be under significant stress from work and school. Her main concern is hypersomnia which is common with her depression. Not sexually active and no plans for pregnancy. Main complaint with Lexapro is flat affect. Would like to cut dose in half.  Depression screen [facility] 2/9 08/28/2017 01/18/2017  Decreased Interest 1 2  Down, Depressed, Hopeless 1 1  PHQ - 2 Score 2 3  Altered sleeping 3 3  Tired, decreased energy 2 3  Change in appetite 2 2  Feeling bad or failure about yourself  1 2  Trouble concentrating 1 0  Moving slowly or fidgety/restless 1 0  Suicidal thoughts 0 0  PHQ-9 Score 12 13  Difficult doing work/chores Somewhat difficult Somewhat difficult   GAD 7 : Generalized Anxiety Score 08/28/2017 03/30/2017 01/18/2017  Nervous, Anxious, on Edge 2 3 3   Control/stop worrying 1 3 3   Worry too much - different things 1 3 3   Trouble relaxing 1 3 2   Restless 1 3 1   Easily annoyed or irritable 2 3 3   Afraid - awful might happen 1 3 0  Total GAD 7 Score 9 21 15   Anxiety Difficulty Not difficult at all Somewhat difficult Somewhat difficult      Objective:   BP 110/72   Ht 5\' 7"  (1.702 m)   Wt 149 lb (67.6 kg)   BMI 23.34 kg/m  NAD. Alert, oriented. Lungs clear. Heart RRR.  Thoughts logical coherent and relevant.  Cheerful affect.  Dressed appropriately.  Assessment:   Problem List Items Addressed This Visit      Other   Anxiety and depression - Primary   Relevant Medications   buPROPion (WELLBUTRIN XL) 150 MG 24 hr tablet       Plan:   Meds ordered this encounter  Medications  . buPROPion (WELLBUTRIN XL) 150 MG 24 hr tablet    Sig: Take 1 tablet (150 mg total) by mouth daily.    Dispense:  30 tablet    Refill:  2     Order Specific Question:   Supervising Provider    Answer:   Mikey Kirschner [[id number]]   Discussed options.  Take half of the dose of Lexapro (10 mg) daily.  Trial of Wellbutrin XL to see if this will give her energy and help her depression.  Reviewed potential adverse effects.  DC med and call if any problems.  If no improvement with Wellbutrin, consider switching Lexapro at that time.  Discussed importance of stress reduction and regular activity.

## 2017-08-28 NOTE — Patient Instructions (Signed)
Cut Lexapro dose in 1/2 (10 mg)

## 2017-09-26 ENCOUNTER — Telehealth: Payer: Self-pay | Admitting: Family Medicine

## 2017-09-26 NOTE — Telephone Encounter (Signed)
Pt wants to have a follow up on anxiety with Hoyle Sauer. She only has same day slots left on the schedule. CB# [phone].

## 2017-09-26 NOTE — Telephone Encounter (Signed)
Nurses-this will be up to [place].  Please forward message to her.  Hoyle Sauer has every right to determine this 1 way or the other

## 2017-09-27 NOTE — Telephone Encounter (Signed)
Appointment scheduled.

## 2017-09-27 NOTE — Telephone Encounter (Signed)
Please feel free to put her in a same day slot. Thanks for letting me know.

## 2017-09-29 ENCOUNTER — Encounter: Payer: Self-pay | Admitting: Nurse Practitioner

## 2017-09-29 ENCOUNTER — Ambulatory Visit (INDEPENDENT_AMBULATORY_CARE_PROVIDER_SITE_OTHER): Payer: Commercial Managed Care - PPO | Admitting: Nurse Practitioner

## 2017-09-29 VITALS — BP 126/84 | Ht 67.0 in | Wt 158.0 lb

## 2017-09-29 DIAGNOSIS — F909 Attention-deficit hyperactivity disorder, unspecified type: Secondary | ICD-10-CM

## 2017-09-29 DIAGNOSIS — F419 Anxiety disorder, unspecified: Secondary | ICD-10-CM

## 2017-09-29 DIAGNOSIS — F329 Major depressive disorder, single episode, unspecified: Secondary | ICD-10-CM

## 2017-09-29 DIAGNOSIS — Z79899 Other long term (current) drug therapy: Secondary | ICD-10-CM

## 2017-09-29 MED ORDER — ESCITALOPRAM OXALATE 20 MG PO TABS: 20.0000 mg | ORAL_TABLET | Freq: Every day | ORAL | 5 refills | Status: DC

## 2017-09-29 MED ORDER — AMPHETAMINE-DEXTROAMPHETAMINE 10 MG PO TABS: ORAL_TABLET | ORAL | 0 refills | Status: DC

## 2017-09-30 ENCOUNTER — Encounter: Payer: Self-pay | Admitting: Nurse Practitioner

## 2017-09-30 NOTE — Progress Notes (Signed)
Subjective: Presents for recheck on her anxiety and depression.  Saw no improvement with use of Wellbutrin.  Continues to experience hypersomnia.  Continues to have anxiety related to school.  Has a hard time focusing, distracted easily.  Also hard time sitting still.  Has been passing her exams. See adult ADHD self report checklist.  Did not have any significant problems when she was younger but had a very structured environment which helped.  Objective: BP 126/84   Ht 5\' 7"  (1.702 m)   Wt 158 lb (71.7 kg)   BMI 24.75 kg/m  NAD.  Alert, oriented.  Lungs clear.  Heart regular rate and rhythm.  Thoughts logical coherent and relevant.  Mildly anxious affect.  Making good eye contact.  Dressed appropriately.  EKG normal limit.  Assessment:  Problem List Items Addressed This Visit      Other   Adult ADHD - Primary   Anxiety and depression   Relevant Medications   escitalopram (LEXAPRO) 20 MG tablet   Other Relevant Orders   EKG 12-Lead    Other Visit Diagnoses    High risk medication use       Relevant Orders   EKG 12-Lead       Plan:  Meds ordered this encounter  Medications  . escitalopram (LEXAPRO) 20 MG tablet    Sig: Take 1 tablet (20 mg total) by mouth daily.    Dispense:  30 tablet    Refill:  5    This prescription was filled on 08/28/2017. Any refills authorized will be placed on file.    Order Specific Question:   Supervising Provider    Answer:   Mikey Kirschner [[id number]]  . amphetamine-dextroamphetamine (ADDERALL) 10 MG tablet    Sig: Take one po qam then one 4 hours later    Dispense:  60 tablet    Refill:  0    Order Specific Question:   Supervising Provider    Answer:   Mikey Kirschner [[id number]]   Stop Wellbutrin.  Go back to 20 mg of Lexapro.  Trial of Adderall 10 mg twice daily as directed over the weekend.  Patient to send a MyChart message early next week regarding medication and dosage.  Discussed potential adverse effects of Adderall.  DC medication if any  problems.  If patient continues to struggle with symptoms after this, recommend referral to mental health for evaluation and treatment. Return in about 3 months (around 12/30/2017) for recheck.

## 2017-10-01 ENCOUNTER — Encounter: Payer: Self-pay | Admitting: Nurse Practitioner

## 2017-10-02 ENCOUNTER — Encounter: Payer: Self-pay | Admitting: Nurse Practitioner

## 2017-10-02 ENCOUNTER — Other Ambulatory Visit: Payer: Self-pay | Admitting: Nurse Practitioner

## 2017-10-02 MED ORDER — AMPHETAMINE-DEXTROAMPHETAMINE 15 MG PO TABS: ORAL_TABLET | ORAL | 0 refills | Status: DC

## 2017-10-04 ENCOUNTER — Encounter: Payer: Self-pay | Admitting: Family Medicine

## 2017-11-07 ENCOUNTER — Telehealth: Payer: Self-pay | Admitting: Family Medicine

## 2017-11-07 NOTE — Telephone Encounter (Signed)
Per pharmacy: Patient has script ready to be filled at pharmacy. Patient verbalized understanding.

## 2017-11-07 NOTE — Telephone Encounter (Signed)
Please check script availablity at her pharmacy

## 2017-11-07 NOTE — Telephone Encounter (Signed)
Last visit was August 26th with Hoyle Sauer. Needs refill on amphetamine-dextroamphetamine (ADDERALL) 15 MG tablet

## 2017-11-22 ENCOUNTER — Encounter: Payer: Self-pay | Admitting: Family Medicine

## 2017-11-23 ENCOUNTER — Ambulatory Visit (INDEPENDENT_AMBULATORY_CARE_PROVIDER_SITE_OTHER): Payer: Commercial Managed Care - PPO | Admitting: *Deleted

## 2017-11-23 DIAGNOSIS — Z23 Encounter for immunization: Secondary | ICD-10-CM

## 2018-01-23 ENCOUNTER — Ambulatory Visit: Payer: Commercial Managed Care - PPO

## 2018-01-24 ENCOUNTER — Other Ambulatory Visit: Payer: Self-pay | Admitting: *Deleted

## 2018-01-24 ENCOUNTER — Encounter: Payer: Self-pay | Admitting: Family Medicine

## 2018-01-24 DIAGNOSIS — Z111 Encounter for screening for respiratory tuberculosis: Secondary | ICD-10-CM

## 2018-01-24 NOTE — Telephone Encounter (Signed)
Please order the QuantiFERON gold TB test for this patient and let her know how to go about getting it done

## 2018-02-05 ENCOUNTER — Encounter: Payer: Self-pay | Admitting: Family Medicine

## 2018-02-16 LAB — QUANTIFERON-TB GOLD PLUS
QuantiFERON Mitogen Value: >10 IU/mL
QuantiFERON Nil Value: 0.04 IU/mL
QuantiFERON TB1 Ag Value: 0.07 IU/mL
QuantiFERON TB2 Ag Value: 0.06 IU/mL
QuantiFERON-TB Gold Plus: NEGATIVE

## 2018-02-26 ENCOUNTER — Ambulatory Visit: Payer: Commercial Managed Care - PPO | Admitting: Family Medicine

## 2018-03-20 ENCOUNTER — Ambulatory Visit: Payer: Commercial Managed Care - PPO | Admitting: Family Medicine

## 2018-11-15 ENCOUNTER — Encounter: Payer: Self-pay | Admitting: Family Medicine

## 2018-11-15 ENCOUNTER — Other Ambulatory Visit: Payer: Self-pay

## 2018-11-15 ENCOUNTER — Ambulatory Visit (INDEPENDENT_AMBULATORY_CARE_PROVIDER_SITE_OTHER): Payer: Commercial Managed Care - PPO | Admitting: Family Medicine

## 2018-11-15 VITALS — BP 116/78 | Temp 97.7°F | Ht 67.0 in | Wt 146.6 lb

## 2018-11-15 DIAGNOSIS — Z23 Encounter for immunization: Secondary | ICD-10-CM | POA: Diagnosis not present

## 2018-11-15 DIAGNOSIS — G2581 Restless legs syndrome: Secondary | ICD-10-CM | POA: Diagnosis not present

## 2018-11-15 DIAGNOSIS — Z0000 Encounter for general adult medical examination without abnormal findings: Secondary | ICD-10-CM | POA: Diagnosis not present

## 2018-11-15 MED ORDER — ROPINIROLE HCL 0.5 MG PO TABS: 0.5000 mg | ORAL_TABLET | Freq: Every day | ORAL | 0 refills | Status: DC

## 2018-11-15 NOTE — Progress Notes (Signed)
Subjective:    Patient ID: Rachel Logan, female    DOB: October 20, 1995, 23 y.o.   MRN: [id number]  HPI The patient comes in today for a wellness visit. 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  A review of medications was also completed.  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   Additional concerns: restless leg syndrome; has noticed in the past year. Pt is in standing on feet for long periods of time in clinical and at work. At night her legs both jerk and she has trouble sleeping due to legs jerking.  Pt also would like Tdap and Flu shot.   Review of Systems  Constitutional: Negative for activity change, appetite change and fatigue.  HENT: Negative for congestion and rhinorrhea.   Eyes: Negative for discharge.  Respiratory: Negative for cough, chest tightness and wheezing.   Cardiovascular: Negative for chest pain.  Gastrointestinal: Negative for abdominal pain, blood in stool and vomiting.  Endocrine: Negative for polyphagia.  Genitourinary: Negative for difficulty urinating and frequency.  Musculoskeletal: Negative for neck pain.  Skin: Negative for color change.  Allergic/Immunologic: Negative for environmental allergies and food allergies.  Neurological: Negative  for weakness and headaches.  Psychiatric/Behavioral: Negative for agitation and behavioral problems.       Objective:   Physical Exam Constitutional:      Appearance: She is well-developed.  HENT:     Head: Normocephalic.     Right Ear: External ear normal.     Left Ear: External ear normal.  Eyes:     Pupils: Pupils are equal, round, and reactive to light.  Neck:     Musculoskeletal: Normal range of motion.     Thyroid: No thyromegaly.  Cardiovascular:     Rate and Rhythm: Normal rate and regular rhythm.     Heart sounds: Normal heart sounds. No murmur.  Pulmonary:     Effort: Pulmonary effort is normal. No respiratory distress.     Breath sounds: Normal breath sounds. No wheezing.  Abdominal:     General: Bowel sounds are normal. There is no distension.     Palpations: Abdomen is soft. There is no mass.     Tenderness: There is no abdominal tenderness.  Musculoskeletal: Normal range of motion.        General: No tenderness.  Lymphadenopathy:     Cervical: No cervical adenopathy.  Skin:    General: Skin is warm and dry.  Neurological:     Mental Status: She is alert and oriented to person, place, and time.     Motor: No abnormal muscle tone.  Psychiatric:        Behavior: Behavior normal.           Assessment & Plan:  Adult wellness-complete.wellness  physical was conducted today. Importance of diet and exercise were discussed in detail.  In addition to this a discussion regarding safety was also covered. We also reviewed over immunizations and gave recommendations regarding current immunization needed for age.  In addition to this additional areas were also touched on including: Preventative health exams needed:  Colonoscopy not indicated  Patient was advised yearly wellness exam  It does appear the patient has restless legs we will initiate medication patient will give [place] a MyChart update in several weeks regarding this Upon further discussion patient  decided not to do medicine because she hopes to have children next year she will let us know if she changes her mind  Cholesterol and sugar recommended somewhere within the next 2 to 3 years not necessary right at the moment with the pandemic going on  Patient denies being depressed overall doing well

## 2018-12-26 ENCOUNTER — Telehealth: Payer: Self-pay | Admitting: Family Medicine

## 2018-12-26 DIAGNOSIS — Z111 Encounter for screening for respiratory tuberculosis: Secondary | ICD-10-CM

## 2018-12-26 NOTE — Telephone Encounter (Signed)
Lab orders placed. Left message on pt voicemail

## 2018-12-26 NOTE — Telephone Encounter (Signed)
Pleas order thankl you

## 2018-12-26 NOTE — Telephone Encounter (Signed)
Needs a TB Quantiferon to be ordered at [place].  Needs the test for school.

## 2018-12-26 NOTE — Telephone Encounter (Signed)
Please advise. Thank you

## 2018-12-30 LAB — QUANTIFERON-TB GOLD PLUS
QuantiFERON Mitogen Value: >10 IU/mL
QuantiFERON Nil Value: 0.02 IU/mL
QuantiFERON TB1 Ag Value: 0.02 IU/mL
QuantiFERON TB2 Ag Value: 0.03 IU/mL
QuantiFERON-TB Gold Plus: NEGATIVE

## 2019-02-21 ENCOUNTER — Ambulatory Visit: Payer: Commercial Managed Care - PPO | Admitting: Family Medicine

## 2019-03-08 NOTE — L&D Delivery Note (Signed)
Delivery Note:   G1P0000 at [redacted]w[redacted]d  Admitting diagnosis: Encounter for induction of labor [Z34.90] Risks: Persistent right umbilical vein, excessive weight gain, anxiety Onset of labor: IOL IOL/Augmentation: AROM, Pitocin, Cytotec and OP Foley ROM: 10/11/2019 @ 1810, clear fluid  Complete dilation at 10/11/2019  2145 Onset of pushing at 2145 FHR second stage Cat 1  Analgesia /Anesthesia intrapartum:Local  Pushing in lithotomy position with CNM and L&D staff support, FOB, Lazarus Salines, present for birth and supportive. Janett Billow, doula, present and supportive.   Delivery of a Live born female  Birth Weight:  pending APGAR: 70, 9  Newborn Delivery   Birth date/time: 10/11/2019 21:52:00 Delivery type: Vaginal, Spontaneous      in cephalic presentation, position OA to LOA.  APGAR:1 min-8 , 5 min-9   Nuchal Cord: Yes x1 Cord double clamped after cessation of pulsation, cut by [facility], FOB.  Collection of cord blood for typing completed. Cord blood donation-None  Arterial cord blood sample-No    Placenta delivered-Spontaneous;Pathology  Uterotonics: IV Pitocin Placenta to pathology for persistent right umbilical vein Uterine tone firm bleeding stable  2nd degree  laceration identified. Right labial laceration Episiotomy:None  Local analgesia: Lidocaine  Repair: 2-0 in the usual fashion, good hemostasis achieved Est. Blood Loss (mL): 5217GJ Complications: [id number], bleeding stable after IV Pitocin  Mom to postpartum.  Baby [name] to Couplet care / Skin to Skin.  Delivery Report:  Review the Delivery Report for details.    Signed: Zettie Pho, MSN 10/11/2019, 10:40 PM

## 2019-03-30 ENCOUNTER — Other Ambulatory Visit: Payer: Self-pay

## 2019-05-16 ENCOUNTER — Other Ambulatory Visit: Payer: Self-pay

## 2019-06-18 ENCOUNTER — Encounter: Payer: Self-pay | Admitting: Family Medicine

## 2019-07-02 ENCOUNTER — Other Ambulatory Visit (HOSPITAL_COMMUNITY): Payer: Self-pay | Admitting: Obstetrics and Gynecology

## 2019-07-02 DIAGNOSIS — Z363 Encounter for antenatal screening for malformations: Secondary | ICD-10-CM

## 2019-07-02 DIAGNOSIS — Z3A24 24 weeks gestation of pregnancy: Secondary | ICD-10-CM

## 2019-07-02 DIAGNOSIS — O283 Abnormal ultrasonic finding on antenatal screening of mother: Secondary | ICD-10-CM

## 2019-07-17 ENCOUNTER — Encounter: Payer: Self-pay | Admitting: *Deleted

## 2019-07-19 ENCOUNTER — Ambulatory Visit (HOSPITAL_COMMUNITY): Payer: Commercial Managed Care - PPO | Attending: Obstetrics and Gynecology

## 2019-07-19 ENCOUNTER — Encounter: Payer: Self-pay | Admitting: *Deleted

## 2019-07-19 ENCOUNTER — Ambulatory Visit: Payer: Commercial Managed Care - PPO | Admitting: *Deleted

## 2019-07-19 ENCOUNTER — Ambulatory Visit: Payer: Commercial Managed Care - PPO

## 2019-07-19 ENCOUNTER — Other Ambulatory Visit: Payer: Self-pay

## 2019-07-19 VITALS — BP 131/82 | HR 119 | Temp 98.5°F

## 2019-07-19 DIAGNOSIS — O283 Abnormal ultrasonic finding on antenatal screening of mother: Secondary | ICD-10-CM | POA: Diagnosis present

## 2019-07-19 DIAGNOSIS — Z3A24 24 weeks gestation of pregnancy: Secondary | ICD-10-CM | POA: Diagnosis present

## 2019-07-19 DIAGNOSIS — Z3A27 27 weeks gestation of pregnancy: Secondary | ICD-10-CM

## 2019-07-19 DIAGNOSIS — Z363 Encounter for antenatal screening for malformations: Secondary | ICD-10-CM | POA: Diagnosis not present

## 2019-07-22 ENCOUNTER — Other Ambulatory Visit: Payer: Self-pay | Admitting: *Deleted

## 2019-07-22 DIAGNOSIS — Z362 Encounter for other antenatal screening follow-up: Secondary | ICD-10-CM

## 2019-08-12 ENCOUNTER — Other Ambulatory Visit: Payer: Self-pay

## 2019-08-12 ENCOUNTER — Ambulatory Visit: Payer: No Typology Code available for payment source | Attending: Obstetrics and Gynecology

## 2019-08-12 ENCOUNTER — Ambulatory Visit: Payer: No Typology Code available for payment source | Admitting: *Deleted

## 2019-08-12 ENCOUNTER — Other Ambulatory Visit: Payer: Self-pay | Admitting: *Deleted

## 2019-08-12 VITALS — BP 114/79 | HR 100

## 2019-08-12 DIAGNOSIS — O0990 Supervision of high risk pregnancy, unspecified, unspecified trimester: Secondary | ICD-10-CM | POA: Insufficient documentation

## 2019-08-12 DIAGNOSIS — Z3A29 29 weeks gestation of pregnancy: Secondary | ICD-10-CM | POA: Diagnosis not present

## 2019-08-12 DIAGNOSIS — Z362 Encounter for other antenatal screening follow-up: Secondary | ICD-10-CM

## 2019-08-16 ENCOUNTER — Ambulatory Visit: Payer: Commercial Managed Care - PPO

## 2019-08-19 ENCOUNTER — Ambulatory Visit: Payer: Commercial Managed Care - PPO

## 2019-09-16 ENCOUNTER — Ambulatory Visit: Payer: No Typology Code available for payment source | Admitting: *Deleted

## 2019-09-16 ENCOUNTER — Ambulatory Visit: Payer: No Typology Code available for payment source | Attending: Obstetrics and Gynecology

## 2019-09-16 ENCOUNTER — Other Ambulatory Visit: Payer: Self-pay

## 2019-09-16 VITALS — BP 121/83 | HR 93

## 2019-09-16 DIAGNOSIS — O0990 Supervision of high risk pregnancy, unspecified, unspecified trimester: Secondary | ICD-10-CM

## 2019-09-16 DIAGNOSIS — Z3A34 34 weeks gestation of pregnancy: Secondary | ICD-10-CM

## 2019-09-16 DIAGNOSIS — Z362 Encounter for other antenatal screening follow-up: Secondary | ICD-10-CM

## 2019-09-17 ENCOUNTER — Encounter (HOSPITAL_COMMUNITY): Payer: Self-pay | Admitting: Obstetrics

## 2019-09-17 ENCOUNTER — Inpatient Hospital Stay (HOSPITAL_COMMUNITY)
Admission: AD | Admit: 2019-09-17 | Discharge: 2019-09-17 | Disposition: A | Discharge status: Home / Self Care | Payer: No Typology Code available for payment source | Attending: Obstetrics | Admitting: Obstetrics

## 2019-09-17 DIAGNOSIS — Y92481 Parking lot as the place of occurrence of the external cause: Secondary | ICD-10-CM | POA: Diagnosis not present

## 2019-09-17 DIAGNOSIS — S80211A Abrasion, right knee, initial encounter: Secondary | ICD-10-CM | POA: Insufficient documentation

## 2019-09-17 DIAGNOSIS — Y9301 Activity, walking, marching and hiking: Secondary | ICD-10-CM | POA: Insufficient documentation

## 2019-09-17 DIAGNOSIS — Z880 Allergy status to penicillin: Secondary | ICD-10-CM | POA: Insufficient documentation

## 2019-09-17 DIAGNOSIS — O9A213 Injury, poisoning and certain other consequences of external causes complicating pregnancy, third trimester: Secondary | ICD-10-CM | POA: Insufficient documentation

## 2019-09-17 DIAGNOSIS — O4703 False labor before 37 completed weeks of gestation, third trimester: Secondary | ICD-10-CM | POA: Insufficient documentation

## 2019-09-17 DIAGNOSIS — W010XXA Fall on same level from slipping, tripping and stumbling without subsequent striking against object, initial encounter: Secondary | ICD-10-CM | POA: Insufficient documentation

## 2019-09-17 LAB — OB RESULTS CONSOLE GBS: GBS: NEGATIVE

## 2019-09-17 NOTE — MAU Note (Signed)
As leaving OB office, she fell.landed on rt knee, hand and side of abd. Denies pain, bleeding or leaking.  States is Oneg.

## 2019-09-17 NOTE — MAU Provider Note (Signed)
History    Chief Complaint  Patient presents with   Fall   Pt had a regular OB appointment today and when she left the office she tripped and fell in the parking lot. States she landed on her right side but denies abdominal trauma. Denies leaking of fluid, vaginal bleeding, or contractions. Endorses + fetal movement.   Fall The accident occurred less than 1 hour ago. The fall occurred while walking. She fell from a height of 3 to 5 ft. She landed on concrete. There was no blood loss. The point of impact was the right knee and right hip. The pain is present in the right knee. The pain is at a severity of 1/10. The pain is mild. Pertinent negatives include no fever, headaches, nausea or vomiting. She has tried nothing for the symptoms.   Past Medical History:  Diagnosis Date   Acid reflux    Anorexia    Anxiety    Depression    Past Surgical History:  Procedure Laterality Date   labiaplasty Bilateral June 2016   History reviewed. No pertinent family history.  Social History   Tobacco Use   Smoking status: Never Smoker   Smokeless tobacco: Never Used  Vaping Use   Vaping Use: Never used  Substance Use Topics   Alcohol use: No   Drug use: No   Allergies:  Allergies  Allergen Reactions   Bee Venom    Penicillins    Medications Prior to Admission  Medication Sig Dispense Refill Last Dose   Calcium Carbonate Antacid (TUMS PO) Take by mouth.   09/17/2019 at Unknown time   Prenatal Vit-Fe Fumarate-FA (PRENATAL VITAMIN PO) Take by mouth.   09/16/2019 at Unknown time   tretinoin (RETIN-A) 0.05 % cream APPLY TO THE AFFECTED AREA ON THE FACE AT BEDTIME.  1    Review of Systems  Constitutional: Negative for chills and fever.  HENT: Negative for nosebleeds and sore throat.   Eyes: Negative for blurred vision and photophobia.  Respiratory: Negative for cough and shortness of breath.   Cardiovascular: Negative for chest pain and leg swelling.  Gastrointestinal:  Positive for heartburn. Negative for nausea and vomiting.  Musculoskeletal: Positive for falls. Negative for back pain and neck pain.  Neurological: Negative for dizziness and headaches.  Psychiatric/Behavioral: Negative for depression. The patient is not nervous/anxious.    Physical Exam   Physical Exam: Blood pressure 124/82, pulse (!) 112, temperature 98.2 F (36.8 C), temperature source Oral, resp. rate 18, height 5\' 7"  (1.702 m), weight 99.2 kg, last menstrual period 01/11/2019, SpO2 100 %. General: NAD Heart: RRR, no murmurs Lungs: CTA b/l  Abd: Soft, NT Ext: no edema, quarter sized abrasion to right knee Neuro: DTRs normal  ED Course  1. Category 1 FHT 2. Occasional contractions on Toco.  3. Will monitor FHT for 4 hours. If remains Category 1, will discharge home.  4. Labor precautions and fetal kick counts reviewed.  5. Abruption precautions reviewed.  6. Will follow-up in the office at next scheduled visit.   Suzan Nailer, CNM, MSN 09/17/2019, 6:03 PM

## 2019-10-01 ENCOUNTER — Encounter: Payer: Self-pay | Admitting: Family Medicine

## 2019-10-04 ENCOUNTER — Telehealth (HOSPITAL_COMMUNITY): Payer: Self-pay | Admitting: *Deleted

## 2019-10-04 NOTE — Telephone Encounter (Signed)
Preadmission screen  

## 2019-10-08 ENCOUNTER — Encounter (HOSPITAL_COMMUNITY): Payer: Self-pay | Admitting: *Deleted

## 2019-10-09 ENCOUNTER — Other Ambulatory Visit (HOSPITAL_COMMUNITY): Payer: No Typology Code available for payment source

## 2019-10-09 ENCOUNTER — Other Ambulatory Visit: Payer: Self-pay | Admitting: Certified Nurse Midwife

## 2019-10-11 ENCOUNTER — Encounter (HOSPITAL_COMMUNITY): Payer: Self-pay | Admitting: Certified Nurse Midwife

## 2019-10-11 ENCOUNTER — Inpatient Hospital Stay (HOSPITAL_COMMUNITY)
Admission: AD | Admit: 2019-10-11 | Discharge: 2019-10-13 | DRG: 806 | Disposition: A | Discharge status: Home / Self Care | Payer: No Typology Code available for payment source | Attending: Obstetrics and Gynecology | Admitting: Obstetrics and Gynecology

## 2019-10-11 ENCOUNTER — Other Ambulatory Visit: Payer: Self-pay

## 2019-10-11 ENCOUNTER — Inpatient Hospital Stay (HOSPITAL_COMMUNITY): Payer: No Typology Code available for payment source

## 2019-10-11 DIAGNOSIS — O358XX0 Maternal care for other (suspected) fetal abnormality and damage, not applicable or unspecified: Secondary | ICD-10-CM | POA: Diagnosis present

## 2019-10-11 DIAGNOSIS — Z3490 Encounter for supervision of normal pregnancy, unspecified, unspecified trimester: Secondary | ICD-10-CM | POA: Diagnosis present

## 2019-10-11 DIAGNOSIS — Z20822 Contact with and (suspected) exposure to covid-19: Secondary | ICD-10-CM | POA: Diagnosis present

## 2019-10-11 DIAGNOSIS — O26893 Other specified pregnancy related conditions, third trimester: Secondary | ICD-10-CM | POA: Diagnosis present

## 2019-10-11 DIAGNOSIS — O9902 Anemia complicating childbirth: Secondary | ICD-10-CM | POA: Diagnosis not present

## 2019-10-11 DIAGNOSIS — D62 Acute posthemorrhagic anemia: Secondary | ICD-10-CM | POA: Diagnosis not present

## 2019-10-11 DIAGNOSIS — Z6791 Unspecified blood type, Rh negative: Secondary | ICD-10-CM

## 2019-10-11 DIAGNOSIS — O9081 Anemia of the puerperium: Secondary | ICD-10-CM | POA: Diagnosis not present

## 2019-10-11 DIAGNOSIS — Z3A39 39 weeks gestation of pregnancy: Secondary | ICD-10-CM | POA: Diagnosis not present

## 2019-10-11 DIAGNOSIS — S3141XA Laceration without foreign body of vagina and vulva, initial encounter: Secondary | ICD-10-CM | POA: Diagnosis not present

## 2019-10-11 LAB — CBC
HCT: 39.1 % (ref 36.0–46.0)
Hemoglobin: 12.9 g/dL (ref 12.0–15.0)
MCH: 29.3 pg (ref 26.0–34.0)
MCHC: 33 g/dL (ref 30.0–36.0)
MCV: 88.7 fL (ref 80.0–100.0)
Platelets: 147 10*3/uL — ABNORMAL LOW (ref 150–400)
RBC: 4.41 MIL/uL (ref 3.87–5.11)
RDW: 13.2 % (ref 11.5–15.5)
WBC: 13.9 10*3/uL — ABNORMAL HIGH (ref 4.0–10.5)
nRBC: 0 % (ref 0.0–0.2)

## 2019-10-11 LAB — SARS CORONAVIRUS 2 BY RT PCR (HOSPITAL ORDER, PERFORMED IN ~~LOC~~ HOSPITAL LAB): SARS Coronavirus 2: NEGATIVE

## 2019-10-11 LAB — TYPE AND SCREEN
ABO/RH(D): O NEG
Antibody Screen: POSITIVE

## 2019-10-11 LAB — ABO/RH: ABO/RH(D): O NEG

## 2019-10-11 MED ORDER — TERBUTALINE SULFATE 1 MG/ML IJ SOLN: 0.2500 mg | Freq: Once | INTRAMUSCULAR | Status: DC | PRN

## 2019-10-11 MED ORDER — LIDOCAINE HCL (PF) 1 % IJ SOLN
30.0000 mL | INTRAMUSCULAR | Status: AC | PRN
  Administered 2019-10-11: 30 mL via SUBCUTANEOUS
  Filled 2019-10-11: qty 30

## 2019-10-11 MED ORDER — FENTANYL CITRATE (PF) 100 MCG/2ML IJ SOLN
INTRAMUSCULAR | Status: AC
  Filled 2019-10-11: qty 2

## 2019-10-11 MED ORDER — SOD CITRATE-CITRIC ACID 500-334 MG/5ML PO SOLN: 30.0000 mL | ORAL | Status: DC | PRN

## 2019-10-11 MED ORDER — ACETAMINOPHEN 325 MG PO TABS: 650.0000 mg | ORAL_TABLET | ORAL | Status: DC | PRN

## 2019-10-11 MED ORDER — MISOPROSTOL 50MCG HALF TABLET
50.0000 ug | ORAL_TABLET | ORAL | Status: DC | PRN
  Administered 2019-10-11 (×2): 50 ug via ORAL
  Filled 2019-10-11 (×2): qty 1

## 2019-10-11 MED ORDER — OXYTOCIN-SODIUM CHLORIDE 30-0.9 UT/500ML-% IV SOLN
INTRAVENOUS | Status: AC
  Filled 2019-10-11: qty 500

## 2019-10-11 MED ORDER — OXYTOCIN-SODIUM CHLORIDE 30-0.9 UT/500ML-% IV SOLN
1.0000 m[IU]/min | INTRAVENOUS | Status: DC
  Administered 2019-10-11: 2 m[IU]/min via INTRAVENOUS

## 2019-10-11 MED ORDER — MISOPROSTOL 25 MCG QUARTER TABLET: 25.0000 ug | ORAL_TABLET | ORAL | Status: DC | PRN

## 2019-10-11 MED ORDER — LACTATED RINGERS IV SOLN: INTRAVENOUS | Status: DC

## 2019-10-11 MED ORDER — ONDANSETRON HCL 4 MG/2ML IJ SOLN: 4.0000 mg | Freq: Four times a day (QID) | INTRAMUSCULAR | Status: DC | PRN

## 2019-10-11 MED ORDER — FENTANYL CITRATE (PF) 100 MCG/2ML IJ SOLN
100.0000 ug | Freq: Once | INTRAMUSCULAR | Status: AC
  Administered 2019-10-11: 100 ug via INTRAVENOUS

## 2019-10-11 NOTE — Progress Notes (Signed)
Cardio malfunction, sitting on counter not on pt

## 2019-10-11 NOTE — Plan of Care (Signed)
Chima Astorino, RN 

## 2019-10-11 NOTE — Progress Notes (Signed)
S: Doing well, feeling some contractions but managing well. Plans to do some walking and [employer] with spouse who is present and supportive.   O: Vitals:   10/11/19 0925 10/11/19 0935  BP: 128/82   Pulse: (!) 103   Resp: 16   Temp: 98.8 F (37.1 C)   TempSrc: Oral   Weight:  103.5 kg  Height:  5\' 7"  (1.702 m)   FHT:  FHR: 130 bpm, variability: moderate,  accelerations:  Present,  decelerations:  Absent UC:   irregular, every 3-8 minutes SVE:   Dilation: 4 Effacement (%): 70 Station: -1 Exam by:: Leesha Veno, CNM  A / P: Induction of labor due to persistent right umbilical vein,  Cytotec x 2 for cervical ripening Fetal Wellbeing:  Category I Pain Control:  Labor support without medications Anticipated MOD:  Working towards active labor. Anticipate NSVD  Rachel Pho, MSN 10/11/2019, 4:31 PM

## 2019-10-11 NOTE — H&P (Signed)
OB ADMISSION/ HISTORY & PHYSICAL:  Admission Date: 10/11/2019  8:40 AM  Admit Diagnosis: Encounter for induction of labor [Z34.90]    Rachel Logan is a 24 y.o. female presenting for IOL due to persistent right umbilical vein. Pt states she had contractions during the night but she was able to sleep through them. Denies leaking of fluid or vaginal bleeding. Endorses + fetal movement. Planning unmedicated birth and would like to utilize the birth tub for hydrotherapy. Spouse, Rachel Logan, present and supportive. Doula will be present in active labor. Eagerly anticipating baby girl, Rachel Logan.   Prenatal History: G1P0000   EDC : 10/23/2019, by Ultrasound  Prenatal care at [facility] since 22 weeks, transfer from [facility]    Prenatal course complicated by: 1. Persistent right umbilical vein, followed growth via U/S and fetal testing 2. Excessive weight gain in pregnancy, 70lbs from pre-pregnancy weight 3. Anxiety, stable off meds 4. Rh negative, O negative 5. Rubella non-immune  Prenatal Labs: ABO, Rh:   O NEG Antibody:  Negative Rubella:   Non-immune RPR:   Non-reactive HBsAg:   Negative HIV:   Negative GBS: Negative/-- (07/13 0000)  1 hr Glucola : 90 Genetic Screening: Panorama low risk, XX Ultrasound: normal anatomy, persistent right umbilical vein, IUGR that resolved, 10% and last growth 55%    Maternal Diabetes: No Genetic Screening: Normal Maternal Ultrasounds/Referrals: Other: persistent right umbilical vein Fetal Ultrasounds or other Referrals:  Referred to Materal Fetal Medicine  Maternal Substance Abuse:  No Significant Maternal Medications:  None Significant Maternal Lab Results:  Group B Strep negative and Rh negative Other Comments:  None  Medical / Surgical History :  Past medical history:  Past Medical History:  Diagnosis Date  . Acid reflux   . Anorexia   . Anxiety   . Depression     Past surgical history:  Past Surgical History:  Procedure Laterality Date   . labiaplasty Bilateral June 2016    Family History:  Family History  Problem Relation Age of Onset  . Leukemia Maternal Grandmother   . Liver cancer Maternal Grandfather   . Heart disease Paternal Grandfather     Social History:  reports that she has never smoked. She has never used smokeless tobacco. She reports that she does not drink alcohol and does not use drugs.  Allergies: Bee venom and Penicillins   Current Medications at time of admission:  Medications Prior to Admission  Medication Sig Dispense Refill Last Dose  . Calcium Carbonate Antacid (TUMS PO) Take by mouth.     . Prenatal Vit-Fe Fumarate-FA (PRENATAL VITAMIN PO) Take by mouth.       Review of Systems: Review of Systems  All other systems reviewed and are negative.  Physical Exam: Vital signs and nursing notes reviewed.  Patient Vitals for the past 24 hrs:  BP Temp Temp src Pulse Resp Height Weight  10/11/19 0935 -- -- -- -- -- 5\' 7"  (1.702 m) 103.5 kg  10/11/19 0925 128/82 98.8 F (37.1 C) Oral (!) 103 16 -- --    General: AAO x 3, NAD Heart: RRR Lungs:CTAB Abdomen: Gravid, NT, Leopold's 7lbs Extremities: 1+ non-pitting edema to lower extremities Genitalia / VE: Dilation: 4/70/-1 Presentation: Vertex Exam by:: Rachel Logan, CNM   FHR: 130BPM, mod variability, + accels, no decels TOCO: Ctx none  Labs:   Pending T&S, CBC, RPR  No results for input(s): WBC, HGB, HCT, PLT in the last 72 hours.   Assessment:  24 y.o. G1P0000 at [redacted]w[redacted]d, persistent  right umbilical vein  1. Induction of labor due to persistent right umbilical vein 2. FHR category 1 3. GBS negative 4. Desires unmedicated birth, plans to use hydrotherapy in labor, waterbirth class completed, certificate in chart 5. Plans to breastfeed 6. Placenta disposal per patient request 7. Anxiety and depression, stable off meds 8. Rubella non-immune 9. Rh negative  Plan:  1. Admit to BS 2. Routine L&D orders 3. Analgesia/anesthesia PRN  4.  Cytotec 43mcg buccal q 4 hours until adequate contractions, consider AROM in active labor 5. Doula support in active labor 6. Anticipate NSVB  Dr. Benjie Logan notified of admission/plan of care  [place], MSN 10/11/2019, 9:57 AM

## 2019-10-11 NOTE — Progress Notes (Signed)
S: After AROM and Pitocin patient desires hydrotherapy for pain. Breathing and moaning through contractions. Spouse present and supportive. Justice Britain, at the bedside providing support.   O: Vitals:   10/11/19 0935 10/11/19 1403 10/11/19 1834 10/11/19 1905  BP:  102/61 118/76 (!) 123/91  Pulse:  93 (!) 101 (!) 106  Resp:  18 16   Temp:  98.2 F (36.8 C) 97.8 F (36.6 C)   TempSrc:  Oral Oral   Weight: 103.5 kg     Height: 5\' 7"  (1.702 m)      FHT:  FHR: 145 bpm, variability: moderate,  accelerations:  Present,  decelerations:  Absent UC:   regular, every 1.5-3 minutes SVE:   Dilation: 5 Effacement (%): 80 Station: -2 Exam by:: Isom Kochan, CNM AROM clear fluid @ 1810  A / P: Induction of labor due to persistent right umbilical vein,  progressing well on pitocin  Fetal Wellbeing:  Category I Pain Control:  Labor support without medications Anticipated MOD:  NSVD   Pt will utilize tub for hydrotherapy.   Suzan Nailer, CNM, MSN 10/11/2019, 8:08 PM

## 2019-10-12 DIAGNOSIS — O9902 Anemia complicating childbirth: Secondary | ICD-10-CM | POA: Diagnosis not present

## 2019-10-12 LAB — CBC
HCT: 31.1 % — ABNORMAL LOW (ref 36.0–46.0)
HCT: 26.6 % — ABNORMAL LOW (ref 36.0–46.0)
Hemoglobin: 10.1 g/dL — ABNORMAL LOW (ref 12.0–15.0)
Hemoglobin: 8.8 g/dL — ABNORMAL LOW (ref 12.0–15.0)
MCH: 29 pg (ref 26.0–34.0)
MCH: 29.7 pg (ref 26.0–34.0)
MCHC: 32.5 g/dL (ref 30.0–36.0)
MCHC: 33.1 g/dL (ref 30.0–36.0)
MCV: 89.4 fL (ref 80.0–100.0)
MCV: 89.9 fL (ref 80.0–100.0)
Platelets: 146 10*3/uL — ABNORMAL LOW (ref 150–400)
Platelets: 131 10*3/uL — ABNORMAL LOW (ref 150–400)
RBC: 3.48 MIL/uL — ABNORMAL LOW (ref 3.87–5.11)
RBC: 2.96 MIL/uL — ABNORMAL LOW (ref 3.87–5.11)
RDW: 13.2 % (ref 11.5–15.5)
RDW: 13.4 % (ref 11.5–15.5)
WBC: 20.4 10*3/uL — ABNORMAL HIGH (ref 4.0–10.5)
WBC: 12.8 10*3/uL — ABNORMAL HIGH (ref 4.0–10.5)
nRBC: 0 % (ref 0.0–0.2)
nRBC: 0 % (ref 0.0–0.2)

## 2019-10-12 LAB — RPR: RPR Ser Ql: NONREACTIVE

## 2019-10-12 MED ORDER — LACTATED RINGERS IV BOLUS
1000.0000 mL | Freq: Once | INTRAVENOUS | Status: AC
  Administered 2019-10-12: 1000 mL via INTRAVENOUS

## 2019-10-12 MED ORDER — IBUPROFEN 600 MG PO TABS
600.0000 mg | ORAL_TABLET | Freq: Four times a day (QID) | ORAL | Status: DC
  Administered 2019-10-12 – 2019-10-13 (×8): 600 mg via ORAL
  Filled 2019-10-12 (×8): qty 1

## 2019-10-12 MED ORDER — SENNOSIDES-DOCUSATE SODIUM 8.6-50 MG PO TABS
2.0000 | ORAL_TABLET | ORAL | Status: DC
  Administered 2019-10-12 – 2019-10-13 (×2): 2 via ORAL
  Filled 2019-10-12 (×2): qty 2

## 2019-10-12 MED ORDER — RHO D IMMUNE GLOBULIN 1500 UNIT/2ML IJ SOSY
300.0000 ug | PREFILLED_SYRINGE | Freq: Once | INTRAMUSCULAR | Status: AC
  Administered 2019-10-12: 300 ug via INTRAVENOUS
  Filled 2019-10-12: qty 2

## 2019-10-12 MED ORDER — MAGNESIUM OXIDE 400 (241.3 MG) MG PO TABS
400.0000 mg | ORAL_TABLET | Freq: Every day | ORAL | Status: DC
  Administered 2019-10-12 – 2019-10-13 (×2): 400 mg via ORAL
  Filled 2019-10-12 (×2): qty 1

## 2019-10-12 MED ORDER — PRENATAL MULTIVITAMIN CH
1.0000 | ORAL_TABLET | Freq: Every day | ORAL | Status: DC
  Administered 2019-10-12 – 2019-10-13 (×2): 1 via ORAL
  Filled 2019-10-12 (×2): qty 1

## 2019-10-12 MED ORDER — SIMETHICONE 80 MG PO CHEW: 80.0000 mg | CHEWABLE_TABLET | ORAL | Status: DC | PRN

## 2019-10-12 MED ORDER — ONDANSETRON HCL 4 MG PO TABS: 4.0000 mg | ORAL_TABLET | ORAL | Status: DC | PRN

## 2019-10-12 MED ORDER — ZOLPIDEM TARTRATE 5 MG PO TABS: 5.0000 mg | ORAL_TABLET | Freq: Every evening | ORAL | Status: DC | PRN

## 2019-10-12 MED ORDER — DIBUCAINE (PERIANAL) 1 % EX OINT: 1.0000 "application " | TOPICAL_OINTMENT | CUTANEOUS | Status: DC | PRN

## 2019-10-12 MED ORDER — POLYSACCHARIDE IRON COMPLEX 150 MG PO CAPS
150.0000 mg | ORAL_CAPSULE | Freq: Every day | ORAL | Status: DC
  Administered 2019-10-12 – 2019-10-13 (×2): 150 mg via ORAL
  Filled 2019-10-12 (×2): qty 1

## 2019-10-12 MED ORDER — COCONUT OIL OIL
1.0000 "application " | TOPICAL_OIL | Status: DC | PRN
  Administered 2019-10-13: 1 via TOPICAL

## 2019-10-12 MED ORDER — WITCH HAZEL-GLYCERIN EX PADS
1.0000 "application " | MEDICATED_PAD | CUTANEOUS | Status: DC | PRN
  Administered 2019-10-12: 1 via TOPICAL

## 2019-10-12 MED ORDER — DIPHENHYDRAMINE HCL 25 MG PO CAPS: 25.0000 mg | ORAL_CAPSULE | Freq: Four times a day (QID) | ORAL | Status: DC | PRN

## 2019-10-12 MED ORDER — ACETAMINOPHEN 325 MG PO TABS: 650.0000 mg | ORAL_TABLET | ORAL | Status: DC | PRN

## 2019-10-12 MED ORDER — BENZOCAINE-MENTHOL 20-0.5 % EX AERO
1.0000 "application " | INHALATION_SPRAY | CUTANEOUS | Status: DC | PRN
  Administered 2019-10-12: 1 via TOPICAL
  Filled 2019-10-12 (×2): qty 56

## 2019-10-12 MED ORDER — TETANUS-DIPHTH-ACELL PERTUSSIS 5-2.5-18.5 LF-MCG/0.5 IM SUSP: 0.5000 mL | Freq: Once | INTRAMUSCULAR | Status: DC

## 2019-10-12 MED ORDER — ONDANSETRON HCL 4 MG/2ML IJ SOLN: 4.0000 mg | INTRAMUSCULAR | Status: DC | PRN

## 2019-10-12 NOTE — Progress Notes (Signed)
PPD # 1 S/P NSVD  Live born female  Birth Weight: 7 lb 7.6 oz (3391 g) APGAR: 8, 9  Newborn Delivery   Birth date/time: 10/11/2019 21:52:00 Delivery type: Vaginal, Spontaneous     Baby name: [name] Delivering provider: Gavin Potters K  Episiotomy:None   Lacerations:2nd degree   Feeding: breast  Pain control at delivery: Local   S:  Reports feeling well, had a syncopal episode last night after delivery while sitting on the toilet, states she feels much better today, denies dizziness and is able to ambulate to the bathroom without assistance             Tolerating po/ No nausea or vomiting             Bleeding is moderate             Pain controlled with acetaminophen and ibuprofen (OTC)             Up ad lib / ambulatory / voiding without difficulties   O:  A & O x 3, in no apparent distress              VS:  Vitals:   10/12/19 0059 10/12/19 0500 10/12/19 0840 10/12/19 1518  BP: 109/82 119/79 113/72 123/79  Pulse: (!) 102 (!) 105 99 (!) 110  Resp: 18 18 17 20   Temp: (!) 97.4 F (36.3 C) 98.9 F (37.2 C) 98.7 F (37.1 C) 98.8 F (37.1 C)  TempSrc: Axillary Oral Oral Oral  SpO2: 100% 98% 99% 100%  Weight:      Height:        LABS:  Recent Labs    10/11/19 1000 10/12/19 0434  WBC 13.9* 20.4*  HGB 12.9 10.1*  HCT 39.1 31.1*  PLT 147* 146*    Blood type: --/--/O NEG (08/07 0434)  Rubella:     I&O: I/O last 3 completed shifts: In: -  Out: 1014 [Blood:1014]          No intake/output data recorded.  Vaccines: TDaP UTD         Flu    Declined   Gen: AAO x 3, NAD  Abdomen: soft, non-tender, non-distended             Fundus: firm, non-tender, U-1  Perineum: repair intact, minimal edema  Lochia: moderate  Extremities: 1+ non-pitting edema to lower extremities, no calf pain or tenderness   A/P: PPD #1 24 y.o., G1P1001   Principal Problem:   Postpartum care following vaginal delivery 8/6 Active Problems:   Encounter for induction of labor   SVD (spontaneous  vaginal delivery) 8/6   Second degree perineal laceration   Labial tear, right   Maternal anemia, with delivery  Anemia - Will start Niferex and mag oxide  - 1000cc LR bolus today - Repeat CBC at 2100  Routine post partum orders Breastfeeding support prn Anticipate discharge tomorrow or Monday   Suzan Nailer, MSN, CNM 10/12/2019, 6:10 PM

## 2019-10-12 NOTE — Progress Notes (Signed)
Pt assisted up to BR with stedy. While pt on toilet performing pericare, she began to complain of becoming lightheaded and became diaphoretic. Call light for assistance activated. Husband at bedside with infant. Pt was instructed to inhale ammonia pearl, but pt stated she couldn't do so. Nurse tech in room and pt began talking normal again, assisted back to bed with stedy x2 assists. VSS. Pt given sandwich and apple juice and stated she was feeling much better. Husband remains at bedside. Pt understands to call for assist OOB.

## 2019-10-13 LAB — RH IG WORKUP (INCLUDES ABO/RH)
ABO/RH(D): O NEG
Fetal Screen: NEGATIVE
Gestational Age(Wks): 39
Unit division: 0

## 2019-10-13 MED ORDER — SODIUM CHLORIDE 0.9 % IV SOLN
510.0000 mg | Freq: Once | INTRAVENOUS | Status: AC
  Administered 2019-10-13: 510 mg via INTRAVENOUS
  Filled 2019-10-13: qty 17

## 2019-10-13 MED ORDER — BENZOCAINE-MENTHOL 20-0.5 % EX AERO: 1.0000 "application " | INHALATION_SPRAY | CUTANEOUS | Status: DC | PRN

## 2019-10-13 MED ORDER — POLYSACCHARIDE IRON COMPLEX 150 MG PO CAPS: 150.0000 mg | ORAL_CAPSULE | Freq: Every day | ORAL | Status: DC

## 2019-10-13 MED ORDER — IBUPROFEN 600 MG PO TABS: 600.0000 mg | ORAL_TABLET | Freq: Four times a day (QID) | ORAL | 0 refills | Status: DC

## 2019-10-13 MED ORDER — COCONUT OIL OIL: 1.0000 "application " | TOPICAL_OIL | 0 refills | Status: DC | PRN

## 2019-10-13 MED ORDER — ACETAMINOPHEN 500 MG PO TABS
1000.0000 mg | ORAL_TABLET | Freq: Four times a day (QID) | ORAL | 2 refills | Status: AC | PRN
Start: 2019-10-13 — End: 2020-10-12

## 2019-10-13 MED ORDER — MAGNESIUM OXIDE -MG SUPPLEMENT 400 (240 MG) MG PO TABS
400.0000 mg | ORAL_TABLET | Freq: Every day | ORAL | Status: DC
Start: 2019-10-13 — End: 2021-05-14

## 2019-10-13 NOTE — Discharge Instructions (Signed)
Lactation outpatient support - home visit ° ° °Linda Coppola °RN, MHA, IBCLC °at [facility]: Lactation Consultant ° °[email or web address] ° °

## 2019-10-13 NOTE — Lactation Note (Addendum)
This note was copied from a baby's chart. Lactation Consultation Note  Patient Name: [name] - 2nd note same consult with orientee Today's Date: 10/13/2019 Reason for consult: Follow-up assessment;Primapara;1st time breastfeeding;Term;Infant weight loss;Nipple pain/trauma  Baby is 38 hours old  Mom's Doula came to the desk and was asking the [facility] for a SNS for supplementing at the breast.  LC and orientee provided a 58F SNS and assisted with latching the left breast with depth and showing dad how he can assist at home to obtain the depth.  Baby latched with depth and noted swallows prior to inserting the 5 FSNS  In  The corner of the mouth  And baby tolerated well and fed for 15 mins, multiple swallows , increased with breast compressions and mom comfortable.  Mom already had the hand pump and LC reviewed.  Mom brought her DEBP from home and the doula was going to show her.  LC recommended to feed the baby on the least sore breast and make sure the baby has depth and add the 5 F SNS and supplement with a larger syringe by pace feeding. Post pump both breast for 10 - 15 mins and save milk to use in the SNS.  When the sore side is healed then start latching on that breast and alternating.  LC stressed the importance of STS feedings.  Mom is following up with private [place] this Tuesday.    Maternal Data    Feeding Feeding Type: Donor Breast Milk Nipple Type: Slow - flow  LATCH Score Latch: Grasps breast easily, tongue down, lips flanged, rhythmical sucking.  Audible Swallowing: Spontaneous and intermittent  Type of Nipple: Everted at rest and after stimulation (areola edema )  Comfort (Breast/Nipple): Filling, red/small blisters or bruises, mild/mod discomfort  Hold (Positioning): Assistance needed to correctly position infant at breast and maintain latch.  LATCH Score: 8  Interventions Interventions: Breast feeding basics reviewed;Assisted with latch;Skin to skin;Breast  massage;Breast compression;Adjust position;Support pillows;Position options;Expressed milk;Coconut oil;Shells;Comfort gels;Hand pump  Lactation Tools Discussed/Used WIC Program: No Pump Review: Setup, frequency, and cleaning;Milk Storage Initiated by::  (reviewed MAI )   Consult Status Consult Status: Follow-up Date: 10/13/19 Follow-up type: In-patient    [place] 10/13/2019, 4:27 PM

## 2019-10-13 NOTE — Progress Notes (Signed)
CSW acknowledged consult and attempted to meet with MOB. However, physician was attending to [facility].  CSW will meet with MOB at a later time.  Jan Olano D. Lissa Morales, MSW, [place] Clinical Social Worker [phone]

## 2019-10-13 NOTE — Lactation Note (Signed)
This note was copied from a baby's chart. Lactation Consultation Note  Patient Name: Rachel Logan [id number] Date: 10/13/2019 Reason for consult: Follow-up assessment;Primapara;1st time breastfeeding;Term;Infant weight loss;Nipple pain/trauma/ postpartum hemorrhage with Iron infusion.    Infant is 37 hours old 36 weeks. Mom had significant blood loss (1552ml). Infant has 5% weight loss. Mom was EBF but started donor breast milk since her supply was diminished. Mom's doula was in with the patient's room and requested assistance to get the baby latched with the SNS. Mom had sore nipples with some abrasion on the left breast. LC assisted latching the baby on the left breast, with SNS with a 5 french feeding tube giving 8 ml of DBM.  Baby tolerated well with audible swallows noted. Explained to Dad how to clean the 5 french feeding tube.   Education reviewed with Mom on nipple care. Mom was given coconut oil, comfort gels and breast shells with instruction on how to alternate usage for treatment. Also, informed Mom she could use express breast milk on her nipples as well.    She has a manual pump that she is using providing her express breast milk to the infant in between feeds. Doula is showing Mom how to set up and use her Spectra DEBP.  Education provided on feeding cues, sore nipples care, signs of engorgement prevention and treatment discussed. Milk storage and cleaning of pump parts also reviewed.   Mom will follow up on Tuesday with a private [place] and doula for reassessment of feedings and weigh check..   Lactation brochure given with contact information for outpatient services.   LATCH Score: 8  Interventions Interventions: Breast feeding basics reviewed;Assisted with latch;Skin to skin;Breast massage;Breast compression;Adjust position;Support pillows;Position options;Expressed milk;Coconut oil;Shells;Comfort gels;Hand pump  Lactation Tools Discussed/Used WIC Program: No Pump Review:  Setup, frequency, and cleaning;Milk Storage Initiated by::  (reviewed MAI )   Consult Status Consult Status: Follow-up Date: 10/13/19 Follow-up type: In-patient    Angella Montas  N[place]olson[place]Springer 10/13/2019, 4:39 PM

## 2019-10-13 NOTE — Discharge Summary (Signed)
OB Discharge Summary  Patient Name: Rachel Logan DOB: [birth year] MRN: [id number]  Date of admission: 10/11/2019 Delivering provider: Gavin Potters K   Admitting diagnosis: Encounter for induction of labor [Z34.90] Intrauterine pregnancy: [redacted]w[redacted]d     Secondary diagnosis: Patient Active Problem List   Diagnosis Date Noted  . Maternal anemia, with delivery 10/12/2019  . Encounter for induction of labor 10/11/2019  . SVD (spontaneous vaginal delivery) 8/6 10/11/2019  . Second degree perineal laceration 10/11/2019  . Labial tear, right 10/11/2019  . Postpartum care following vaginal delivery 8/6 10/11/2019  . Adult ADHD 09/29/2017  . Anxiety and depression 01/18/2017   Additional problems:PPH   Date of discharge: 10/13/2019   Discharge diagnosis: Principal Problem:   Postpartum care following vaginal delivery 8/6 Active Problems:   Encounter for induction of labor   SVD (spontaneous vaginal delivery) 8/6   Second degree perineal laceration   Labial tear, right   Maternal anemia, with delivery                                                              Post partum procedures:IV iron  Augmentation: AROM, Pitocin, Cytotec and OP Foley Pain control: Local  Laceration:2nd degree  Episiotomy:None  Complications: [id number]  Hospital course:  Induction of Labor With Vaginal Delivery   24 y.o. yo G1P1001 at [redacted]w[redacted]d was admitted to the hospital 10/11/2019 for induction of labor.  Indication for induction: persistent R umbilical vein.  Patient had an uncomplicated labor course as follows: Membrane Rupture Time/Date: 6:10 PM ,10/11/2019   Delivery Method:Vaginal, Spontaneous  Episiotomy: None  Lacerations:  2nd degree  Details of delivery can be found in separate delivery note.  Patient had a routine postpartum course. Patient is discharged home 10/13/19.  Newborn Data: Birth date:10/11/2019  Birth time:9:52 PM  Gender:Female  Living status:Living  Apgars:8 ,9  Weight:3391 g    Physical exam  Vitals:   10/12/19 0840 10/12/19 1518 10/12/19 2221 10/13/19 0530  BP: 113/72 123/79 (!) 104/59 109/62  Pulse: 99 (!) 110 (!) 106 98  Resp: 17 20 17 17   Temp: 98.7 F (37.1 C) 98.8 F (37.1 C) 98.3 F (36.8 C) 98.5 F (36.9 C)  TempSrc: Oral Oral Oral Oral  SpO2: 99% 100% 100% 100%  Weight:      Height:       General: alert, cooperative and no distress Lochia: appropriate Uterine Fundus: firm Incision: N/A Perineum: repair intact, no edema DVT Evaluation: No cords or calf tenderness. Calf/Ankle edema is present  Delayed milk production concern, will give referral to home [facility] consult - [facility]  Labs: Lab Results  Component Value Date   WBC 12.8 (H) 10/12/2019   HGB 8.8 (L) 10/12/2019   HCT 26.6 (L) 10/12/2019   MCV 89.9 10/12/2019   PLT 131 (L) 10/12/2019   No flowsheet data found. Edinburgh Postnatal Depression Scale Screening Tool 10/13/2019 10/12/2019  I have been able to laugh and see the funny side of things. 0 (No Data)  I have looked forward with enjoyment to things. 0 -  I have blamed myself unnecessarily when things went wrong. 2 -  I have been anxious or worried for no good reason. 1 -  I have felt scared or panicky for no good reason. 0 -  Things  have been getting on top of me. 1 -  I have been so unhappy that I have had difficulty sleeping. 0 -  I have felt sad or miserable. 1 -  I have been so unhappy that I have been crying. 1 -  The thought of harming myself has occurred to me. 0 -  Edinburgh Postnatal Depression Scale Total 6 -   Vaccines: TDaP UTD         Flu    UTD  Discharge instruction:  per After Visit Summary,  [facility] OB booklet and  "Understanding Mother & Baby Care" hospital booklet  After Visit Meds:  Allergies as of 10/13/2019      Reactions   Bee Venom    Penicillins       Medication List    TAKE these medications   acetaminophen 500 MG tablet Commonly known as: TYLENOL Take 2 tablets (1,000 mg  total) by mouth every 6 (six) hours as needed.   benzocaine-Menthol 20-0.5 % Aero Commonly known as: DERMOPLAST Apply 1 application topically as needed for irritation (perineal discomfort).   calcium carbonate 500 MG chewable tablet Commonly known as: TUMS - dosed in mg elemental calcium Chew 1 tablet by mouth 3 (three) times daily as needed for indigestion or heartburn.   coconut oil Oil Apply 1 application topically as needed.   ibuprofen 600 MG tablet Commonly known as: ADVIL Take 1 tablet (600 mg total) by mouth every 6 (six) hours.   iron polysaccharides 150 MG capsule Commonly known as: Ferrex 150 Take 1 capsule (150 mg total) by mouth daily.   Magnesium Oxide 400 (240 Mg) MG Tabs Take 1 tablet (400 mg total) by mouth daily. For prevention of constipation.            Discharge Care Instructions  (From admission, onward)         Start     Ordered   10/13/19 0000  Discharge wound care:       Comments: Sitz baths 2 times /day with warm water x 1 week. May add herbals: 1 ounce dried comfrey leaf* 1 ounce calendula flowers 1 ounce lavender flowers  Supplies can be found online at [employer] sources at [employer], [employer]  1/2 ounce dried uva ursi leaves 1/2 ounce witch hazel blossoms (if you can find them) 1/2 ounce dried sage leaf 1/2 cup sea salt Directions: Bring 2 quarts of water to a boil. Turn off heat, and place 1 ounce (approximately 1 large handful) of the above mixed herbs (not the salt) into the pot. Steep, covered, for 30 minutes.  Strain the liquid well with a fine mesh strainer, and discard the herb material. Add 2 quarts of liquid to the tub, along with the 1/2 cup of salt. This medicinal liquid can also be made into compresses and peri-rinses.   10/13/19 8315          Diet: iron rich diet  Activity: Advance as tolerated. Pelvic rest for 6 weeks.   Postpartum contraception: Not Discussed  Newborn Data: Live born female   Birth Weight: 7 lb 7.6 oz (3391 g) APGAR: 34, 9  Newborn Delivery   Birth date/time: 10/11/2019 21:52:00 Delivery type: Vaginal, Spontaneous      named [name] Baby Feeding: Breast and donnor milk and formula Disposition:home with mother   Delivery Report:  Review the Delivery Report for details.    Follow up:  Follow-up Information    Suzan Nailer, CNM. Schedule an  appointment as soon as possible for a visit in 2 week(s).   Specialty: Certified Nurse Midwife Why: Postpartum follow up Contact information: [place] [phone]                 Signed: Juliene Pina, CNM, MSN 10/13/2019, 8:23 AM

## 2019-10-13 NOTE — Progress Notes (Signed)
CSW received consult for hx of anxiety, depression, and anorexia.  CSW met with MOB at bedside to offer support and complete assessment.  On arrival, CSW introduced self and stated purpose for visit. FOB and infant Rachel Logan were present however, after PPDA and SIDS education, CSW stepped out of room to privately complete safety check via phone due FOB comforting fussy infant via skin to skin. MOB and FOB were pleasant and engaged during visit.   CSW provided education regarding the baby blues period vs. perinatal mood disorders, discussed treatment and gave resources for mental health follow up if concerns arise.  CSW recommends self-evaluation during the postpartum time period using the New Mom Checklist from Postpartum Progress and encouraged MOB and FOB to contact a medical professional if symptoms are noted at any time. MOB and FOB stated understanding and denied any questions.   CSW provided review of Sudden Infant Death Syndrome (SIDS) precautions. MOB and FOB stated understanding and denied any questions. MOB confirmed having all needed items for baby including car seat and bassinet and crib for baby's safe sleep.  During assessment, MOB confirmed hx of anxiety, depression, and anorexia. However, MOB denied any sx of anorexia since 2015/16 or depression/anxiety since 2019. MOB reports she has been managing sx by staying active and talking with FOB and mom. MOB explained FOB/husband is very conscious of MOB's mood and needs, therefore, he is very good at planning activities as needed. MOB denied any  SI, HI, domestic violence, or other BH dx. MOB stated she emotionally plained for the worse regarding labor and delivery, however, things have gone pretty well and she looking forward to going home with baby. MOB denied any additional needs and declined any additional resources.     CSW identifies no further need for intervention and no barriers to discharge at this time.  Rachel Logan D. Rachel Logan, MSW,  [place] Clinical Social Worker [phone]

## 2019-10-15 LAB — SURGICAL PATHOLOGY

## 2019-10-17 ENCOUNTER — Ambulatory Visit: Payer: Self-pay

## 2019-10-17 NOTE — Lactation Note (Signed)
This note was copied from a baby's chart. Lactation Consultation Note  Patient Name: Rachel Logan GOVPC'H Date: 10/17/2019   Baby girl Calhoun readmitted for jaundice.  Took mom DEBP.  Mom brought her same kit from home that she used in the hospital. Mom reports her doctor recently prescribed her Reglan to try and increase her milk supply.Mom reports pumping about a half an oz at each pumping session. Trying to pump every 2 hours.  Discussed pumping past two minutes past the last drops.  Discussed STS when able  and continue to add massage and hand expression to pumping.  Discussed Dr. Vincente Liberty website on how to increase your milk. Praised efforts.   Rachel Logan 10/17/2019, 8:59 PM

## 2019-10-18 ENCOUNTER — Inpatient Hospital Stay (HOSPITAL_COMMUNITY): Admission: AD | Admit: 2019-10-18 | Payer: No Typology Code available for payment source | Source: Home / Self Care

## 2021-04-08 IMAGING — US US MFM OB DETAIL+14 WK
1 series · 13 of 28 positions shown · non-contrast
Comparison: none

[Series 1: us mfm ob detail+14 wk · 91 acquisitions, 13 frames shown]
[im 4/91]
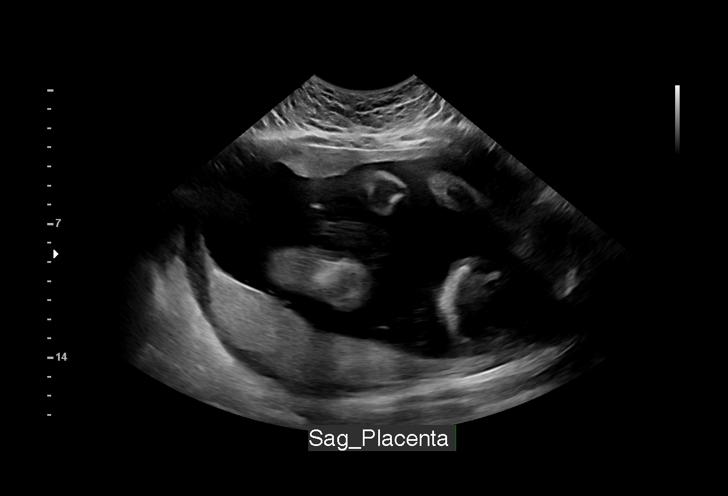
[im 11/91]
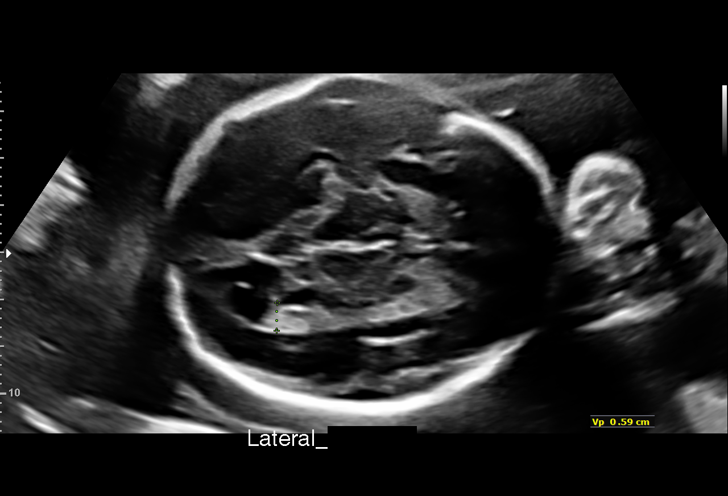
[im 17/91]
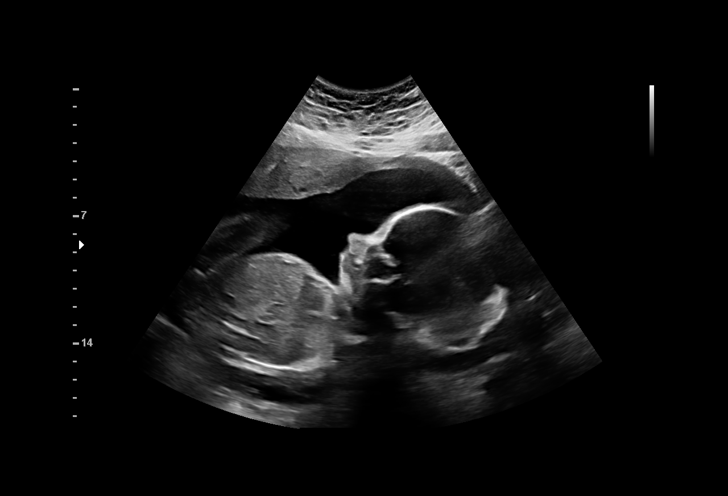
[im 24/91]
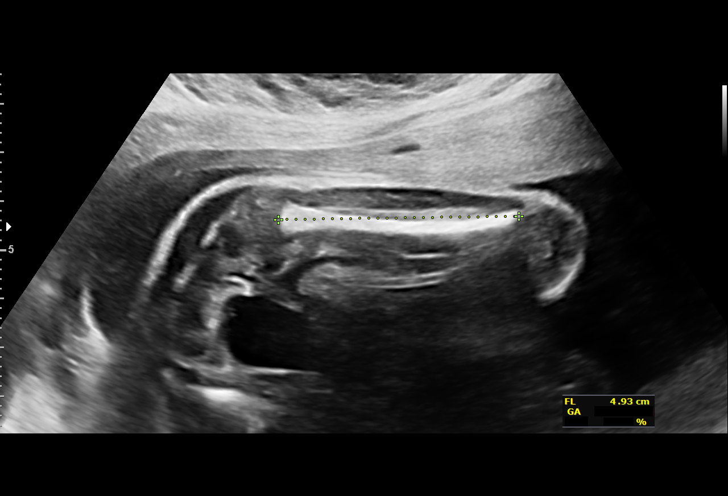
[im 31/91]
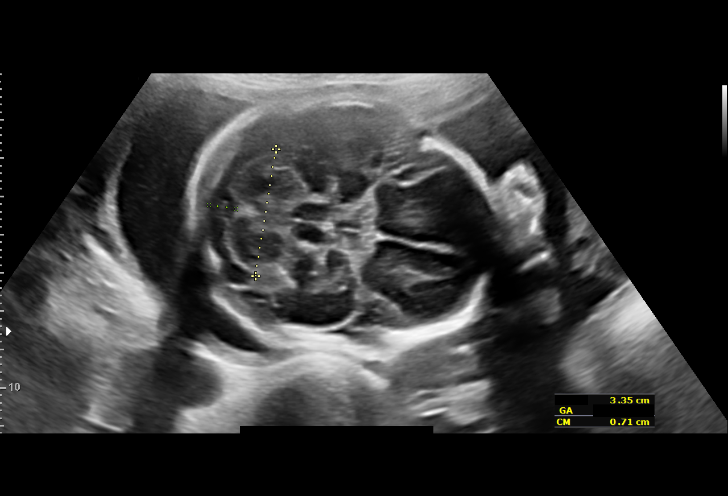
[im 37/91]
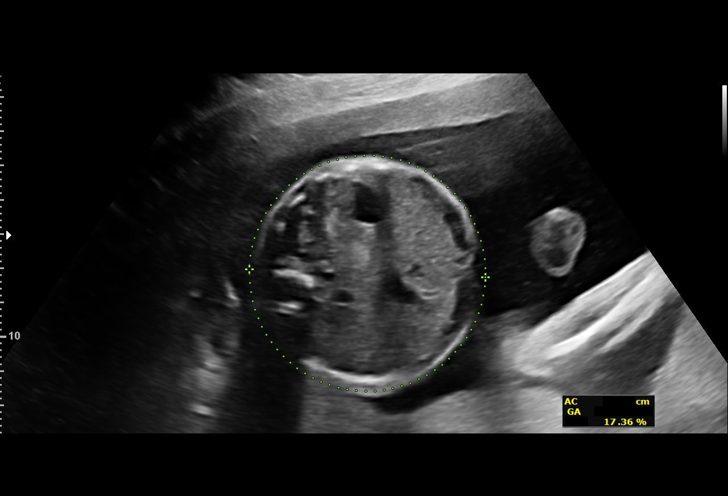
[im 47/91]
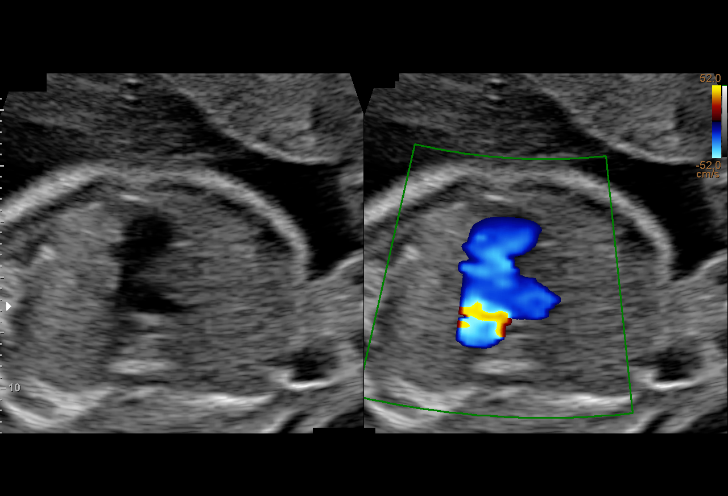
[im 54/91]
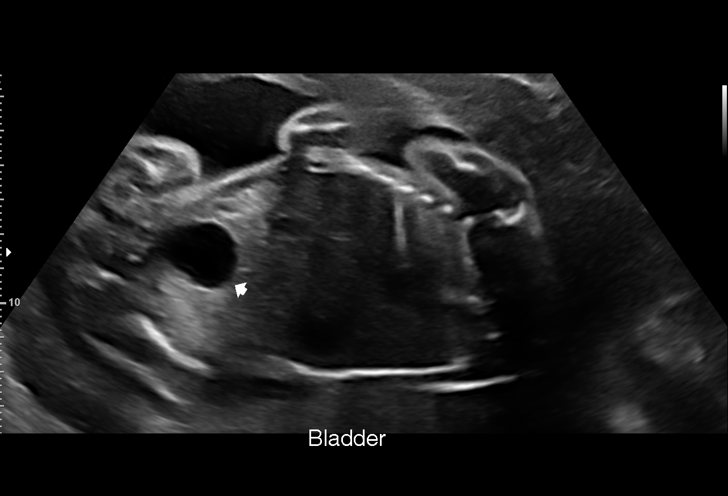
[im 61/91]
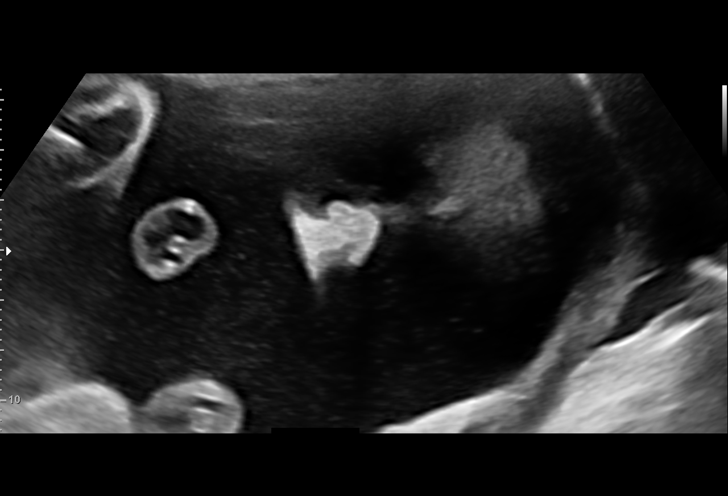
[im 67/91]
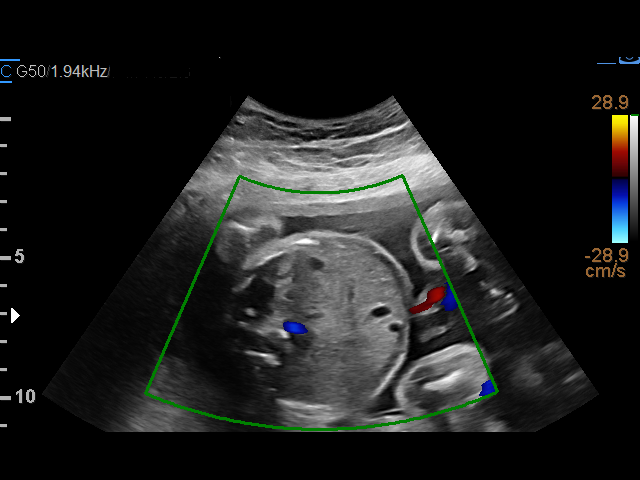
[im 74/91]
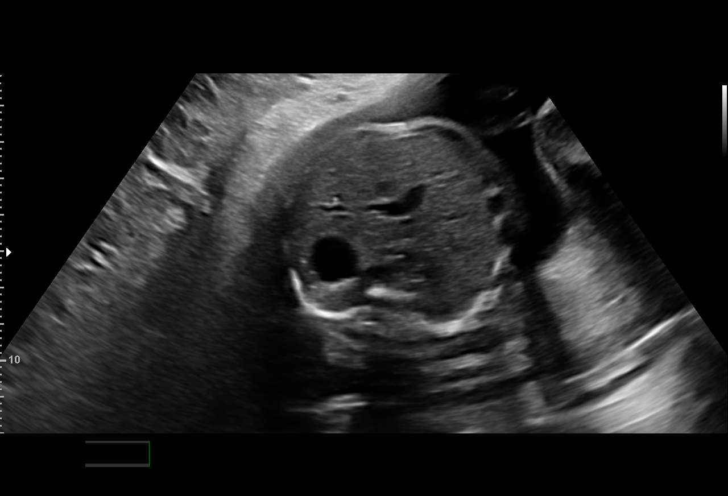
[im 81/91]
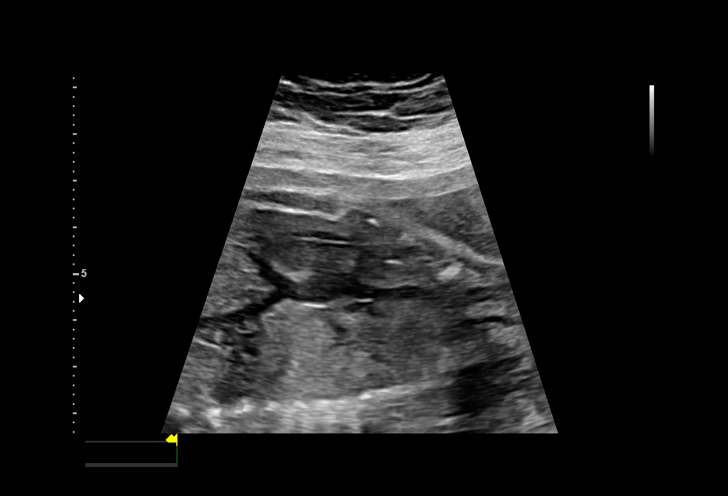
[im 87/91]
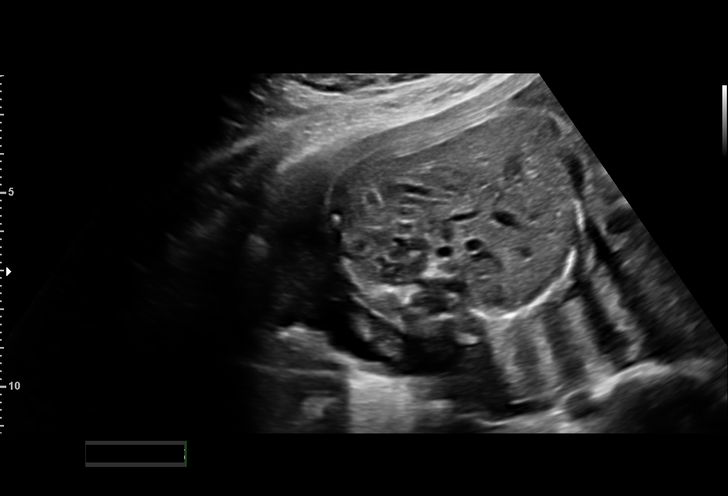

[13 of 28 positions shown; findings below may reference images not displayed]

[id number]
                   DEMNAT CNM

Indications

 27 weeks gestation of pregnancy
 Encounter for antenatal screening for
 malformations
 Umbilical vein abnormality complicating
 pregnancy

Fetal Evaluation

 Num Of Fetuses:         1
 Fetal Heart Rate(bpm):  151
 Cardiac Activity:       Observed
 Presentation:           Cephalic
 Placenta:               Posterior Fundal
 P. Cord Insertion:      Previously Visualized

 Amniotic Fluid
 AFI FV:      Within normal limits

                             Largest Pocket(cm)

Biometry

 BPD:      70.5  mm     G. Age:  28w 2d         81  %    CI:        79.79   %    70 - 86
                                                         FL/HC:      19.6   %    18.6 -
 HC:      249.4  mm     G. Age:  27w 1d         25  %    HC/AC:      1.15        1.05 -
 AC:      217.6  mm     G. Age:  26w 2d         20  %    FL/BPD:     69.5   %    71 - 87
 FL:         49  mm     G. Age:  26w 3d         21  %    FL/AC:      22.5   %    20 - 24
 HUM:      44.5  mm     G. Age:  26w 3d         33  %
 CER:      33.5  mm     G. Age:  29w 2d         88  %
 LV:        5.9  mm
 CM:        7.1  mm

 Est. FW:     947  gm      2 lb 1 oz     21  %
OB History

 Gravidity:    1         Term:   0
 Living:       0
Gestational Age

 LMP:           27w 0d        Date:  01/11/19                 EDD:   10/18/19
 U/S Today:     27w 0d                                        EDD:   10/18/19
 Best:          27w 0d     Det. By:  LMP  (01/11/19)          EDD:   10/18/19
Anatomy

 Cranium:               Appears normal         Aortic Arch:            Appears normal
 Cavum:                 Appears normal         Ductal Arch:            Appears normal
 Ventricles:            Appears normal         Diaphragm:              Appears normal
 Choroid Plexus:        Not well visualized    Stomach:                Appears normal, left
                                                                       sided
 Cerebellum:            Appears normal         Abdomen:                Persistent right
                                                                       umbilical vein
 Posterior Fossa:       Appears normal         Abdominal Wall:         Appears nml (cord
                                                                       insert, abd wall)
 Nuchal Fold:           Not applicable (>20    Cord Vessels:           Appears normal (3
                        wks GA)                                        vessel cord)
 Face:                  Appears normal         Kidneys:                Appear normal
                        (orbits and profile)
 Lips:                  Appears normal         Bladder:                Appears normal
 Thoracic:              Appears normal         Spine:                  Not well visualized
 Heart:                 Appears normal         Upper Extremities:      Appears normal
                        (4CH, axis, and
                        situs)
 RVOT:                  Appears normal         Lower Extremities:      Appears normal
 LVOT:                  Appears normal

 Other:  Female gender Technically difficult due to fetal position.
Cervix Uterus Adnexa

 Cervix
 Length:           4.55  cm.
 Normal appearance by transabdominal scan.
Impression

 Ms. Spirit, Yohan1 P0 at 27-weeks' gestation, is here for a
 second opinion ultrasound.  On your office scan, persistent
 right umbilical vein was suspected.
 On cell free fetal DNA screening, the risks of fetal
 aneuploidies are not increased.  Patient reports no chronic
 medical conditions.
 We performed a fetal anatomy scan.  We confirmed the
 diagnosis of persistent right umbilical vein (apex curving
 toward stomach).  It has an intrahepatic course.  Gallbladder
 was not visualized to assess its location in relation to the
 umbilical vein.  No other obvious fetal structural defects are
 seen.  Cardiac anatomy appears normal.  Both kidneys
 appear normal and intracranial structures appear normal.
 Fetal spine could not be evaluated clearly because of fetal
 position (normal at your office scan).
 I explained the diagnosis.  Isolated (absence of other
 anomalies) persistent right umbilical vein carries a very good
 prognosis.  Previous observational studies reported slight
 increased incidence of stillbirth and some recommend
 delivery at 37 weeks gestation.  In the absence of other
 anomalies and with good fetal growth, delivery at 39 weeks
 gestation is reasonable.  If considerable maternal anxiety is
 present delivery between 37- and 39-weeks' gestation may
 be considered.
Recommendations

 -An appointment was made for her to return in 4 weeks for
 fetal growth and reassess PRUV.
                 Kashishi, Kaping

## 2021-05-02 IMAGING — US US MFM OB FOLLOW-UP
1 series · 14 of 28 positions shown · non-contrast
Comparison: none

[Series 1: us mfm ob follow-up · 81 acquisitions, 14 frames shown]
[im 3/81]
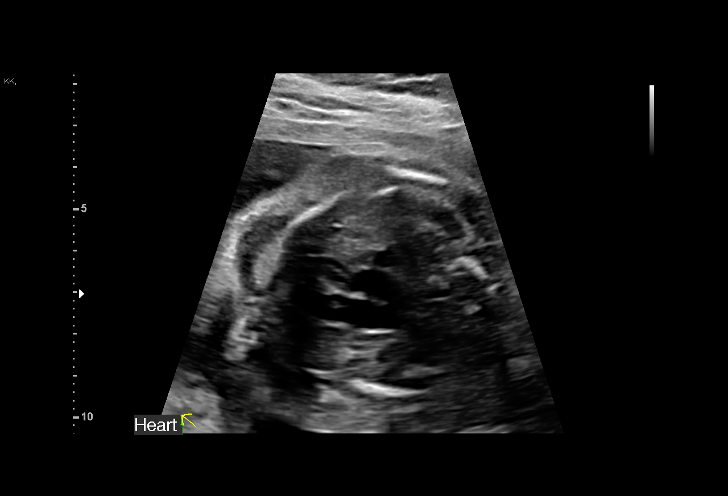
[im 9/81]
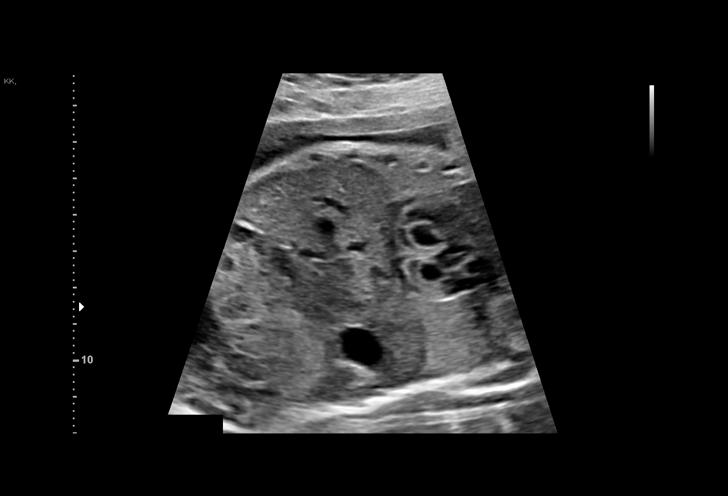
[im 15/81]
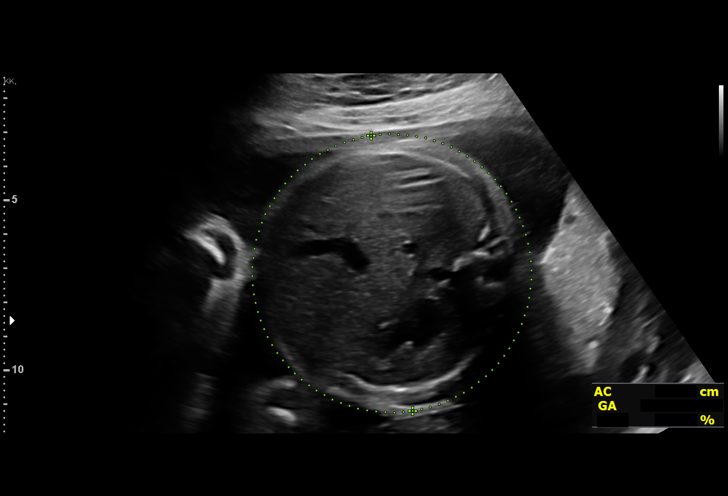
[im 21/81]
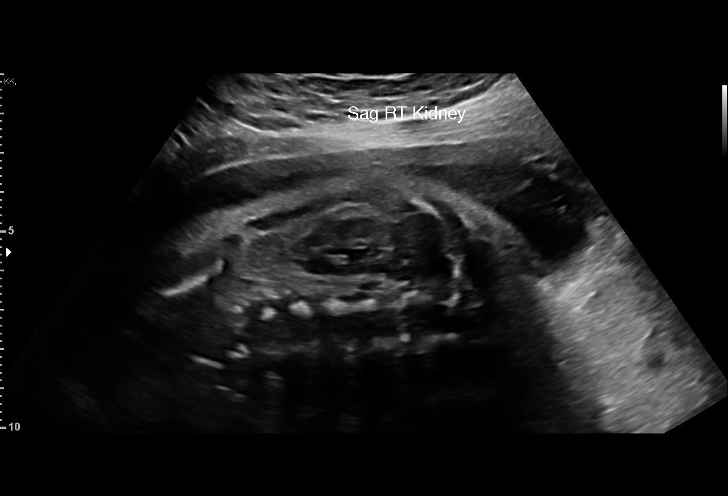
[im 27/81]
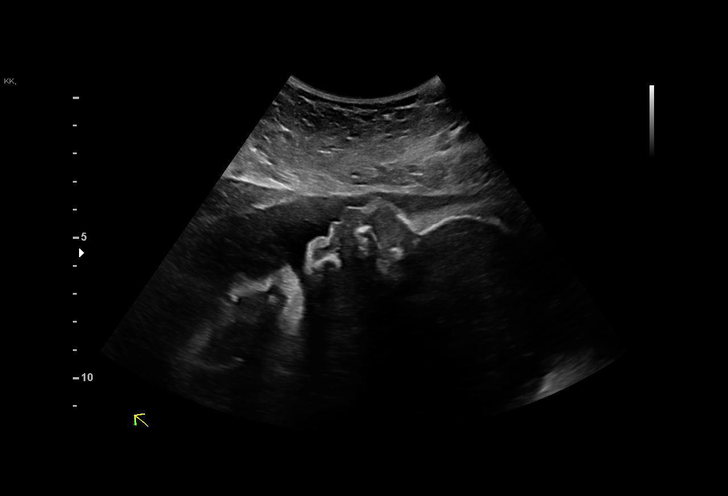
[im 33/81]
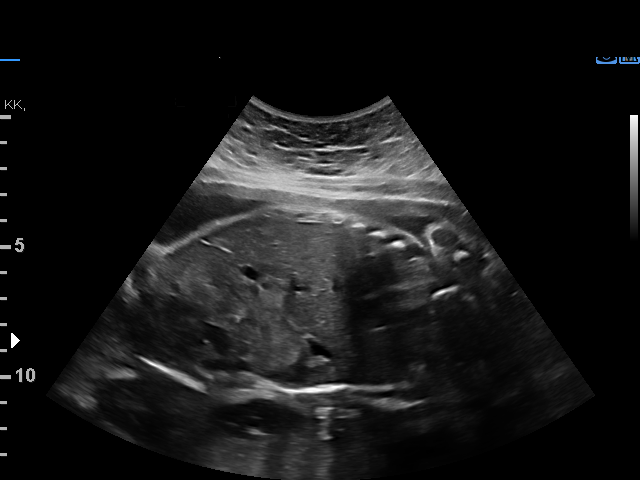
[im 39/81]
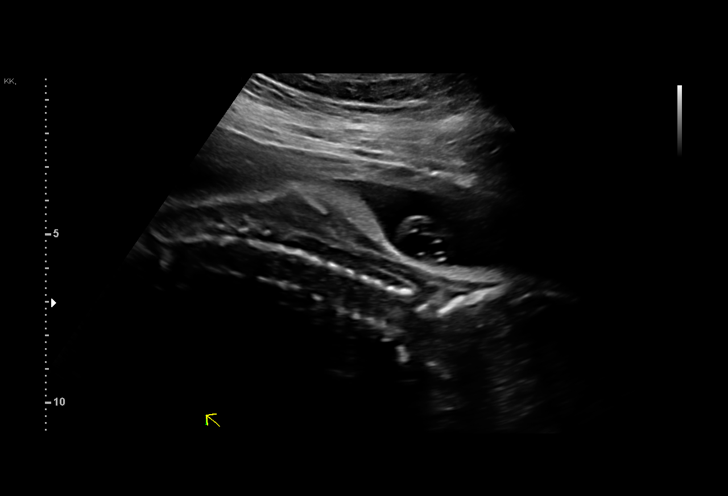
[im 45/81]
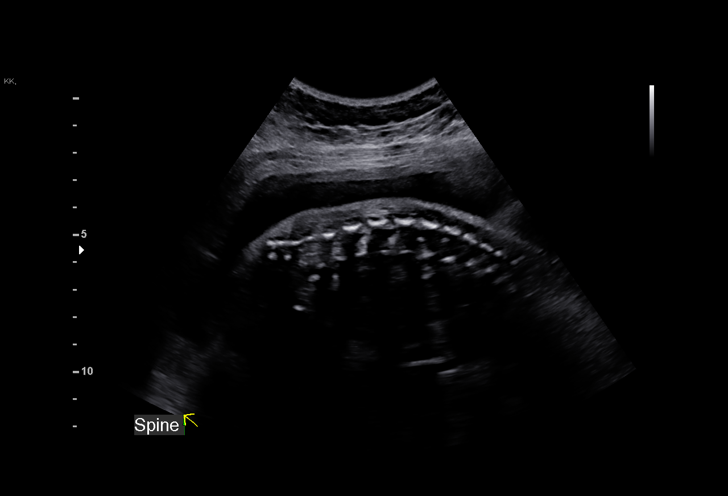
[im 51/81]
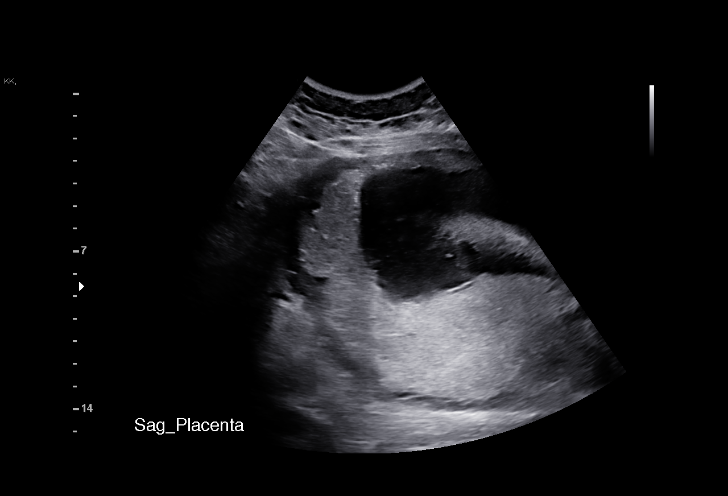
[im 57/81]
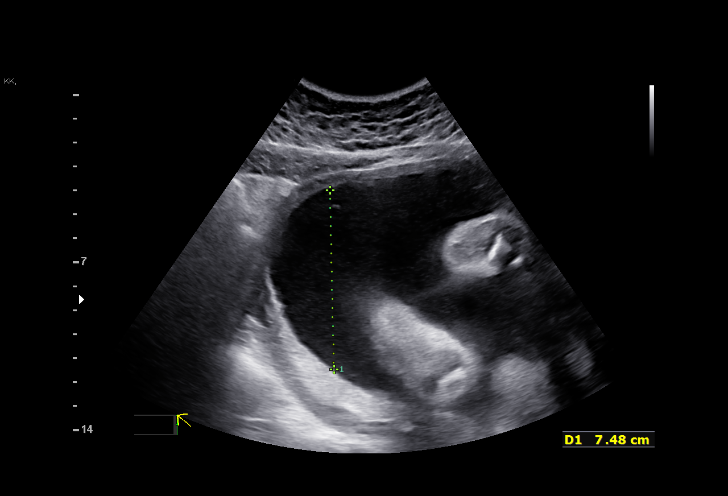
[im 63/81]
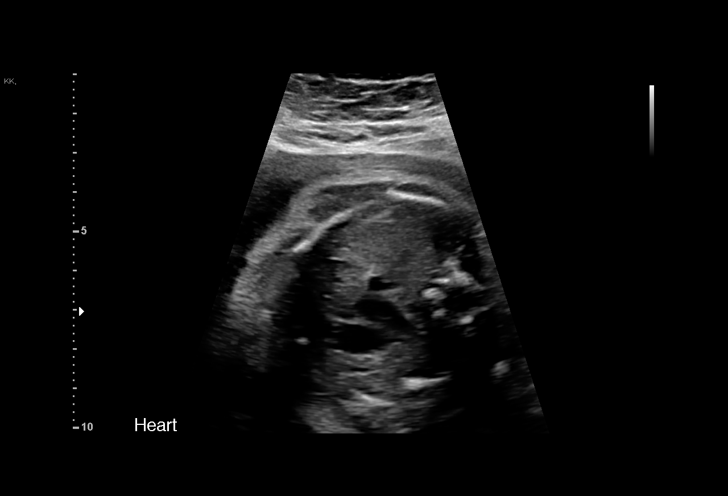
[im 69/81]
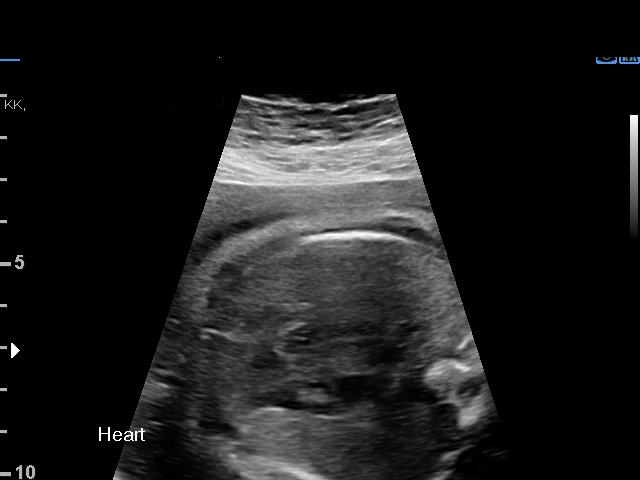
[im 75/81]
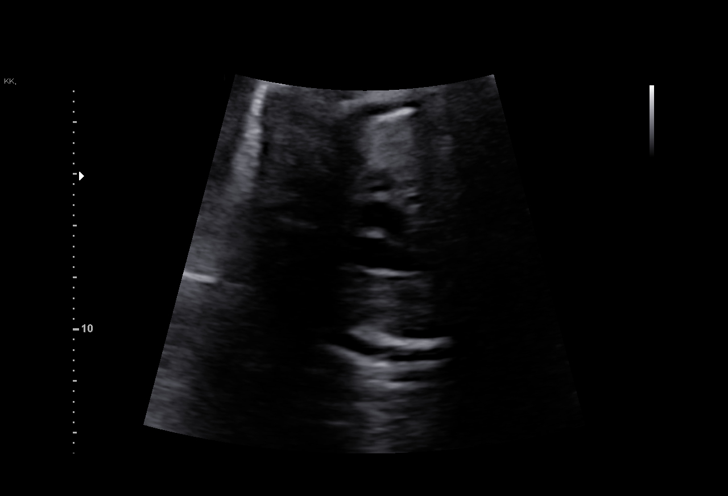
[im 81/81]
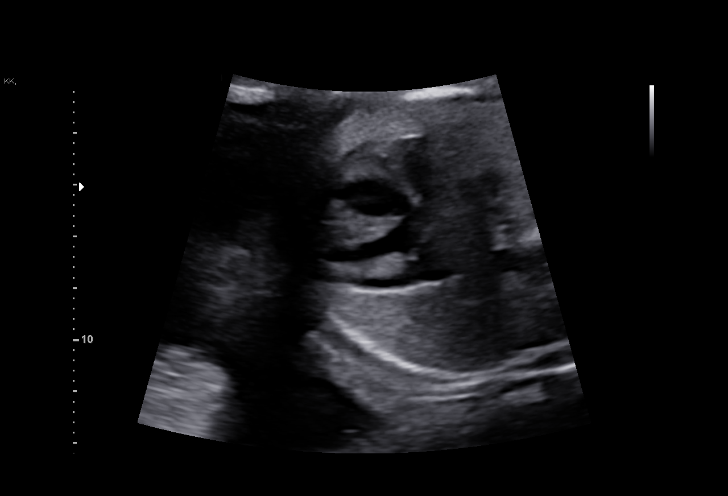

[14 of 28 positions shown; findings below may reference images not displayed]

[id number]] [facility]
                   CHANG CNM

Indications

 Umbilical vein abnormality complicating
 pregnancy
 29 weeks gestation of pregnancy
 Antenatal follow-up for nonvisualized fetal
 anatomy
 Low Risk NIPS
Fetal Evaluation

 Num Of Fetuses:         1
 Fetal Heart Rate(bpm):  160
 Cardiac Activity:       Observed
 Presentation:           Cephalic
 Placenta:               Posterior Fundal
 P. Cord Insertion:      Not well visualized

 Amniotic Fluid
 AFI FV:      Within normal limits

                             Largest Pocket(cm)

Biometry

 BPD:      82.5  mm     G. Age:  33w 1d       > 99  %    CI:        80.06   %    70 - 86
                                                         FL/HC:      19.7   %    19.2 -
 HC:      291.3  mm     G. Age:  32w 1d         83  %    HC/AC:      1.15        0.99 -
 AC:      253.8  mm     G. Age:  29w 4d         40  %    FL/BPD:     69.5   %    71 - 87
 FL:       57.3  mm     G. Age:  30w 0d         44  %    FL/AC:      22.6   %    20 - 24

 Est. FW:    7133  gm      3 lb 6 oz     55  %
OB History

 Gravidity:    1         Term:   0
 Living:       0
Gestational Age

 LMP:           30w 3d        Date:  01/11/19                 EDD:   10/18/19
 U/S Today:     31w 2d                                        EDD:   10/12/19
 Best:          29w 5d     Det. By:  Previous Ultrasound      EDD:   10/23/19
                                     (05/27/19)
Anatomy

 Cranium:               Appears normal         Aortic Arch:            Previously seen
 Cavum:                 Previously seen        Ductal Arch:            Previously seen
 Ventricles:            Previously seen        Diaphragm:              Appears normal
 Choroid Plexus:        Appears normal         Stomach:                Appears normal, left
                                                                       sided
 Cerebellum:            Previously seen        Abdomen:                Persistent right
                                                                       umbilical vein
 Posterior Fossa:       Previously seen        Abdominal Wall:         Previously seen
 Nuchal Fold:           Not applicable (>20    Cord Vessels:           Appears normal (3
                        wks GA)                                        vessel cord)
 Face:                  Appears normal         Kidneys:                Appear normal
                        (orbits and profile)
 Lips:                  Previously seen        Bladder:                Appears normal
 Thoracic:              Appears normal         Spine:                  Appears normal
 Heart:                 Appears normal         Upper Extremities:      Previously seen
                        (4CH, axis, and
                        situs)
 RVOT:                  Previously seen        Lower Extremities:      Previously seen
 LVOT:                  Previously seen

 Other:  Female gender Technically difficult due to fetal position.
Cervix Uterus Adnexa

 Cervix
 Not visualized (advanced GA >94wks)
Comments

 This patient was seen for a follow up growth scan due to a
 fetus with a persistent right umbilical vein.  She denies any
 problems since her last exam.
 She was informed that the fetal growth and amniotic fluid
 level appears appropriate for her gestational age.
 A follow up exam was scheduled in 5 weeks.

## 2021-05-04 ENCOUNTER — Encounter: Payer: Self-pay | Admitting: Nurse Practitioner

## 2021-05-04 ENCOUNTER — Ambulatory Visit: Payer: 59 | Admitting: Nurse Practitioner

## 2021-05-04 ENCOUNTER — Other Ambulatory Visit: Payer: Self-pay

## 2021-05-04 VITALS — BP 132/70 | HR 90 | Temp 98.0°F | Wt 165.0 lb

## 2021-05-04 DIAGNOSIS — M549 Dorsalgia, unspecified: Secondary | ICD-10-CM | POA: Diagnosis not present

## 2021-05-04 LAB — POC URINALYSIS WITH MICROSCOPIC (NON AUTO)MANUAL RESULT
Spec Grav, UA: 1.01 (ref 1.010–1.025)
pH, UA: 6 (ref 5.0–8.0)

## 2021-05-04 LAB — POCT URINE PREGNANCY: Preg Test, Ur: NEGATIVE

## 2021-05-04 NOTE — Progress Notes (Signed)
Subjective:    Patient ID: Rachel Logan, female    DOB: 04/21/1995, 26 y.o.   MRN: [id number]  HPI  Patient here for lower back pain times x5 days.  Patient states that she started having lower back pain last Thursday that seem to be worse on her left side.  Patient describes pain as a stabbing pain patient also states that she started her menstrual cycle on Thursday as well.  Patient also complained of constipation that happened around Thursday as well.  Patient decided to do an enema on Monday which helped her back pain.  Patient states that she currently still feels lower back pain but it is now just a dull ache that she feels more on the right side.  Patient denies any urgency, frequency, fevers, colicky back pain, burning with urination, history of artery disease or family artery disease, history of abdominal aortic aneurysm or family history of abdominal aortic aneurysm, nausea, vomiting, diarrhea, blood in stool, blood in urine.     Review of Systems  Musculoskeletal:  Positive for back pain.  All other systems reviewed and are negative.     Objective:   Physical Exam Exam conducted with a chaperone present.  Constitutional:      General: She is not in acute distress.    Appearance: Normal appearance. She is normal weight. She is not ill-appearing or toxic-appearing.  Cardiovascular:     Rate and Rhythm: Normal rate and regular rhythm.     Pulses: Normal pulses.     Heart sounds: Normal heart sounds. No murmur heard. Pulmonary:     Effort: Pulmonary effort is normal. No respiratory distress.     Breath sounds: Normal breath sounds. No wheezing.  Abdominal:     General: Abdomen is flat. Bowel sounds are normal. There is no distension.     Palpations: Abdomen is soft. There is no mass.     Tenderness: There is no abdominal tenderness. There is no right CVA tenderness, left CVA tenderness, guarding or rebound.     Hernia: No hernia is present. There is no hernia in the left  inguinal area or right inguinal area.  Genitourinary:    Exam position: Lithotomy position.     Pubic Area: No rash or pubic lice.      Labia:        Right: No rash, tenderness, lesion or injury.        Left: No rash, tenderness, lesion or injury.      Vagina: Normal.     Cervix: No cervical motion tenderness, discharge, friability, lesion, erythema, cervical bleeding or eversion.     Uterus: Normal. Not tender.      Adnexa: Right adnexa normal and left adnexa normal.     Rectum: Normal.     Comments: Likely retroverted uterus Musculoskeletal:        General: Normal range of motion.     Cervical back: Normal range of motion and neck supple. No rigidity or tenderness.  Lymphadenopathy:     Cervical: No cervical adenopathy.     Lower Body: No right inguinal adenopathy. No left inguinal adenopathy.  Skin:    General: Skin is warm.     Capillary Refill: Capillary refill takes less than 2 seconds.  Neurological:     General: No focal deficit present.     Mental Status: She is alert and oriented to person, place, and time.  Psychiatric:        Mood and Affect: Mood normal.  Behavior: Behavior normal.          Assessment & Plan:   1. Costovertebral angle pain -Possible retroversion of uterus cause pressure on the rectum which was aggravated due to constipation. -UTI considering.  Urine culture pending. -Endometriosis considered.  We will monitor pain and if pain corresponds with menstrual cycle will possibly refer to GYN. -Pancreatitis considered.  Amylase and lipase pending. - Kidney stone considered.  If pain persist will get kidney ultrasound. -Low suspicion for abdominal aortic aneurysm at this time due to lack of family history, lack of personal history, lack of risk factors, and the young age.  However if pain persist will consider ultrasound of aorta. - POCT urine pregnancy = NEGATIVE - CBC with Differential/Platelet - Urine Culture - POC urinalysis w microscopic  (non auto) = small leukocytes - Amylase - Lipase - CMP14+EGFR -Follow-up in 4 weeks or sooner if pain does not get better or becomes worse.

## 2021-05-05 ENCOUNTER — Telehealth: Payer: Self-pay | Admitting: Family Medicine

## 2021-05-05 DIAGNOSIS — R1032 Left lower quadrant pain: Secondary | ICD-10-CM | POA: Diagnosis not present

## 2021-05-05 DIAGNOSIS — Z1329 Encounter for screening for other suspected endocrine disorder: Secondary | ICD-10-CM

## 2021-05-05 DIAGNOSIS — Z131 Encounter for screening for diabetes mellitus: Secondary | ICD-10-CM

## 2021-05-05 DIAGNOSIS — R109 Unspecified abdominal pain: Secondary | ICD-10-CM | POA: Diagnosis not present

## 2021-05-05 DIAGNOSIS — Z130 Encounter for screening for diseases of the blood and blood-forming organs and certain disorders involving the immune mechanism: Secondary | ICD-10-CM

## 2021-05-05 DIAGNOSIS — Z13220 Encounter for screening for lipoid disorders: Secondary | ICD-10-CM

## 2021-05-05 NOTE — Telephone Encounter (Signed)
Patient has physical on 3/10 and needing labs done. ?

## 2021-05-07 LAB — URINE CULTURE: Organism ID, Bacteria: NO GROWTH

## 2021-05-07 LAB — SPECIMEN STATUS REPORT

## 2021-05-07 NOTE — Telephone Encounter (Signed)
Blood work ordered in Epic. Left message to return call 

## 2021-05-07 NOTE — Telephone Encounter (Signed)
Patient notified

## 2021-05-07 NOTE — Telephone Encounter (Signed)
Hoskins, Carolyn C, NP     CBC, CMP, Lipid and TSH. Thanks.   

## 2021-05-11 DIAGNOSIS — Z1329 Encounter for screening for other suspected endocrine disorder: Secondary | ICD-10-CM | POA: Diagnosis not present

## 2021-05-11 DIAGNOSIS — Z13220 Encounter for screening for lipoid disorders: Secondary | ICD-10-CM | POA: Diagnosis not present

## 2021-05-11 DIAGNOSIS — Z131 Encounter for screening for diabetes mellitus: Secondary | ICD-10-CM | POA: Diagnosis not present

## 2021-05-11 DIAGNOSIS — Z130 Encounter for screening for diseases of the blood and blood-forming organs and certain disorders involving the immune mechanism: Secondary | ICD-10-CM | POA: Diagnosis not present

## 2021-05-12 LAB — CBC WITH DIFFERENTIAL/PLATELET
Basophils Absolute: 0.1 10*3/uL (ref 0.0–0.2)
Basos: 1 %
EOS (ABSOLUTE): 0.1 10*3/uL (ref 0.0–0.4)
Eos: 2 %
Hematocrit: 38.7 % (ref 34.0–46.6)
Hemoglobin: 13 g/dL (ref 11.1–15.9)
Immature Grans (Abs): 0 10*3/uL (ref 0.0–0.1)
Immature Granulocytes: 0 %
Lymphocytes Absolute: 1.8 10*3/uL (ref 0.7–3.1)
Lymphs: 35 %
MCH: 28.3 pg (ref 26.6–33.0)
MCHC: 33.6 g/dL (ref 31.5–35.7)
MCV: 84 fL (ref 79–97)
Monocytes Absolute: 0.5 10*3/uL (ref 0.1–0.9)
Monocytes: 9 %
Neutrophils Absolute: 2.8 10*3/uL (ref 1.4–7.0)
Neutrophils: 53 %
Platelets: 228 10*3/uL (ref 150–450)
RBC: 4.6 x10E6/uL (ref 3.77–5.28)
RDW: 12.5 % (ref 11.7–15.4)
WBC: 5.2 10*3/uL (ref 3.4–10.8)

## 2021-05-12 LAB — COMPREHENSIVE METABOLIC PANEL
ALT: 14 IU/L (ref 0–32)
AST: 16 IU/L (ref 0–40)
Albumin/Globulin Ratio: 1.8 (ref 1.2–2.2)
Albumin: 4.9 g/dL (ref 3.9–5.0)
Alkaline Phosphatase: 90 IU/L (ref 44–121)
BUN/Creatinine Ratio: 20 (ref 9–23)
BUN: 16 mg/dL (ref 6–20)
Bilirubin Total: 0.6 mg/dL (ref 0.0–1.2)
CO2: 23 mmol/L (ref 20–29)
Calcium: 9.6 mg/dL (ref 8.7–10.2)
Chloride: 101 mmol/L (ref 96–106)
Creatinine, Ser: 0.81 mg/dL (ref 0.57–1.00)
Globulin, Total: 2.8 g/dL (ref 1.5–4.5)
Glucose: 92 mg/dL (ref 70–99)
Potassium: 4.3 mmol/L (ref 3.5–5.2)
Sodium: 139 mmol/L (ref 134–144)
Total Protein: 7.7 g/dL (ref 6.0–8.5)
eGFR: 103 mL/min/{1.73_m2} (ref >59)

## 2021-05-12 LAB — LIPID PANEL
Chol/HDL Ratio: 3.8 ratio (ref 0.0–4.4)
Cholesterol, Total: 155 mg/dL (ref 100–199)
HDL: 41 mg/dL (ref >39)
LDL Chol Calc (NIH): 101 mg/dL — ABNORMAL HIGH (ref 0–99)
Triglycerides: 65 mg/dL (ref 0–149)
VLDL Cholesterol Cal: 13 mg/dL (ref 5–40)

## 2021-05-12 LAB — TSH: TSH: 0.891 u[IU]/mL (ref 0.450–4.500)

## 2021-05-14 ENCOUNTER — Ambulatory Visit (INDEPENDENT_AMBULATORY_CARE_PROVIDER_SITE_OTHER): Payer: 59 | Admitting: Nurse Practitioner

## 2021-05-14 ENCOUNTER — Other Ambulatory Visit: Payer: Self-pay

## 2021-05-14 VITALS — BP 104/72 | HR 101 | Temp 98.4°F | Ht 67.0 in | Wt 160.0 lb

## 2021-05-14 DIAGNOSIS — Z124 Encounter for screening for malignant neoplasm of cervix: Secondary | ICD-10-CM

## 2021-05-14 DIAGNOSIS — Z01419 Encounter for gynecological examination (general) (routine) without abnormal findings: Secondary | ICD-10-CM | POA: Diagnosis not present

## 2021-05-14 DIAGNOSIS — Z1151 Encounter for screening for human papillomavirus (HPV): Secondary | ICD-10-CM | POA: Diagnosis not present

## 2021-05-14 NOTE — Progress Notes (Signed)
1 ? ?Subjective:  ? ? Patient ID: Rachel Logan, female    DOB: [birth year], 26 y.o.   MRN: [id number] ? ?HPI ?The patient comes in today for a wellness visit. ? ? ? ?A review of their health history was completed. ? A review of medications was also completed. ? ?Any needed refills; none ? ?Eating habits: good ? ?Falls/  MVA accidents in past few months: no ? ?Regular exercise: work active and regular exercise  ? ?Specialist pt sees on regular basis: no ? ?Preventative health issues were discussed.  ? ?Additional concerns:  ? ? ?Review of Systems ? ?   ?Objective:  ? Physical Exam ? ? ? ? ?   ?Assessment & Plan:  ? ? ?

## 2021-05-14 NOTE — Progress Notes (Signed)
? ?Subjective:  ? ? Patient ID: Rachel Logan, female    DOB: [birth year], 27 y.o.   MRN: [id number] ? ?HPI ?Patient present today for a preventive health annual visit. Report healthy dieting and daily exercise routine. Taking miralax daily for constipation which has improved. Back pain resolved from previous visit. Routine vision exams. Due for dental exam. Nonsmoker. Sexually active, same sexual partner, denies vaginal itching, discharge, dysuria, hematuria, frequency or urgency. Regular menses, normal flow.  ?Routine immunizations up to date.  ? ?Review of Systems  ?Constitutional:  Negative for activity change, appetite change and fatigue.  ?HENT:  Negative for sore throat and trouble swallowing.   ?Respiratory:  Negative for cough, chest tightness, shortness of breath and wheezing.   ?Cardiovascular:  Negative for chest pain.  ?Gastrointestinal:  Negative for abdominal distention, abdominal pain, constipation, diarrhea, nausea and vomiting.  ?Genitourinary:  Negative for difficulty urinating, dysuria, enuresis, frequency, genital sores, menstrual problem, pelvic pain, urgency and vaginal discharge.  ? ?   ?Objective:  ? Physical Exam ?Vitals and nursing note reviewed. Exam conducted with a chaperone present.  ?Constitutional:   ?   General: She is not in acute distress. ?   Appearance: She is well-developed.  ?Neck:  ?   Thyroid: No thyromegaly.  ?   Trachea: No tracheal deviation.  ?   Comments: Thyroid non tender to palpation. No mass or goiter noted.  ?Cardiovascular:  ?   Rate and Rhythm: Normal rate and regular rhythm.  ?   Heart sounds: Normal heart sounds. No murmur heard. ?Pulmonary:  ?   Effort: Pulmonary effort is normal.  ?   Breath sounds: Normal breath sounds.  ?Chest:  ?Breasts: ?   Right: No swelling, inverted nipple, mass, skin change or tenderness.  ?   Left: No swelling, inverted nipple, mass, skin change or tenderness.  ?Abdominal:  ?   General: There is no distension.  ?   Palpations: Abdomen  is soft.  ?   Tenderness: There is no abdominal tenderness.  ?Genitourinary: ?   Comments: External GU: no rashes or lesions. Vagina: pink, no discharge. Cervix normal in appearance. No CMT. Bimanual exam: no tenderness or obvious masses ?Musculoskeletal:  ?   Cervical back: Normal range of motion and neck supple.  ?Lymphadenopathy:  ?   Cervical: No cervical adenopathy.  ?   Upper Body:  ?   Right upper body: No supraclavicular, axillary or pectoral adenopathy.  ?   Left upper body: No supraclavicular, axillary or pectoral adenopathy.  ?Skin: ?   General: Skin is warm and dry.  ?   Findings: No rash.  ?Neurological:  ?   Mental Status: She is alert and oriented to person, place, and time.  ?Psychiatric:     ?   Mood and Affect: Mood normal.     ?   Behavior: Behavior normal.     ?   Thought Content: Thought content normal.     ?   Judgment: Judgment normal.  ? ? ?Today's Vitals  ? 05/14/21 0821  ?BP: 104/72  ?Pulse: (!) 101  ?Temp: 98.4 ?F (36.9 ?C)  ?SpO2: 99%  ?Weight: 160 lb (72.6 kg)  ?Height: '5\' 7"'  (1.702 m)  ? ?Body mass index is 25.06 kg/m?. ? ?Depression screen [facility] 2/9 05/14/2021  ?Decreased Interest 0  ?Down, Depressed, Hopeless 0  ?PHQ - 2 Score 0  ?Altered sleeping -  ?Tired, decreased energy -  ?Change in appetite -  ?Feeling bad or failure  about yourself  -  ?Trouble concentrating -  ?Moving slowly or fidgety/restless -  ?Suicidal thoughts -  ?PHQ-9 Score -  ?Difficult doing work/chores -  ? ?Results for orders placed or performed in visit on 05/05/21  ?CBC with Differential/Platelet  ?Result Value Ref Range  ? WBC 5.2 3.4 - 10.8 x10E3/uL  ? RBC 4.60 3.77 - 5.28 x10E6/uL  ? Hemoglobin 13.0 11.1 - 15.9 g/dL  ? Hematocrit 38.7 34.0 - 46.6 %  ? MCV 84 79 - 97 fL  ? MCH 28.3 26.6 - 33.0 pg  ? MCHC 33.6 31.5 - 35.7 g/dL  ? RDW 12.5 11.7 - 15.4 %  ? Platelets 228 150 - 450 x10E3/uL  ? Neutrophils 53 Not Estab. %  ? Lymphs 35 Not Estab. %  ? Monocytes 9 Not Estab. %  ? Eos 2 Not Estab. %  ? Basos 1 Not Estab.  %  ? Neutrophils Absolute 2.8 1.4 - 7.0 x10E3/uL  ? Lymphocytes Absolute 1.8 0.7 - 3.1 x10E3/uL  ? Monocytes Absolute 0.5 0.1 - 0.9 x10E3/uL  ? EOS (ABSOLUTE) 0.1 0.0 - 0.4 x10E3/uL  ? Basophils Absolute 0.1 0.0 - 0.2 x10E3/uL  ? Immature Granulocytes 0 Not Estab. %  ? Immature Grans (Abs) 0.0 0.0 - 0.1 x10E3/uL  ?Comprehensive metabolic panel  ?Result Value Ref Range  ? Glucose 92 70 - 99 mg/dL  ? BUN 16 6 - 20 mg/dL  ? Creatinine, Ser 0.81 0.57 - 1.00 mg/dL  ? eGFR 103 >59 mL/min/1.73  ? BUN/Creatinine Ratio 20 9 - 23  ? Sodium 139 134 - 144 mmol/L  ? Potassium 4.3 3.5 - 5.2 mmol/L  ? Chloride 101 96 - 106 mmol/L  ? CO2 23 20 - 29 mmol/L  ? Calcium 9.6 8.7 - 10.2 mg/dL  ? Total Protein 7.7 6.0 - 8.5 g/dL  ? Albumin 4.9 3.9 - 5.0 g/dL  ? Globulin, Total 2.8 1.5 - 4.5 g/dL  ? Albumin/Globulin Ratio 1.8 1.2 - 2.2  ? Bilirubin Total 0.6 0.0 - 1.2 mg/dL  ? Alkaline Phosphatase 90 44 - 121 IU/L  ? AST 16 0 - 40 IU/L  ? ALT 14 0 - 32 IU/L  ?Lipid panel  ?Result Value Ref Range  ? Cholesterol, Total 155 100 - 199 mg/dL  ? Triglycerides 65 0 - 149 mg/dL  ? HDL 41 >39 mg/dL  ? VLDL Cholesterol Cal 13 5 - 40 mg/dL  ? LDL Chol Calc (NIH) 101 (H) 0 - 99 mg/dL  ? Chol/HDL Ratio 3.8 0.0 - 4.4 ratio  ?TSH  ?Result Value Ref Range  ? TSH 0.891 0.450 - 4.500 uIU/mL  ? ?Reviewed labs with patient.  ? ?   ?Assessment & Plan:  ? ?Problem List Items Addressed This Visit   ?None ?Visit Diagnoses   ? ? Well woman exam    -  Primary  ? Relevant Orders  ? IGP, Aptima HPV  ? Screening for cervical cancer      ? Relevant Orders  ? IGP, Aptima HPV  ? Screening for HPV (human papillomavirus)      ? Relevant Orders  ? IGP, Aptima HPV  ? ?  ?Continue to provided on weight loss, healthy diet and regular activity. ?Recommend dental exam this year. ?Continue prenatal vitamins.  ?Return in about 1 year (around 05/15/2022) for physical.  ? ?

## 2021-05-15 ENCOUNTER — Encounter: Payer: Self-pay | Admitting: Nurse Practitioner

## 2021-05-19 LAB — IGP, APTIMA HPV: HPV Aptima: POSITIVE — AB

## 2021-05-21 ENCOUNTER — Encounter: Payer: Self-pay | Admitting: Nurse Practitioner

## 2021-06-01 ENCOUNTER — Ambulatory Visit: Payer: 59 | Admitting: Nurse Practitioner

## 2021-06-06 IMAGING — US US MFM OB FOLLOW-UP
1 series · 13 of 28 positions shown · non-contrast
Comparison: none

[Series 1: us mfm ob follow-up · 38 acquisitions, 13 frames shown]
[im 2/38]
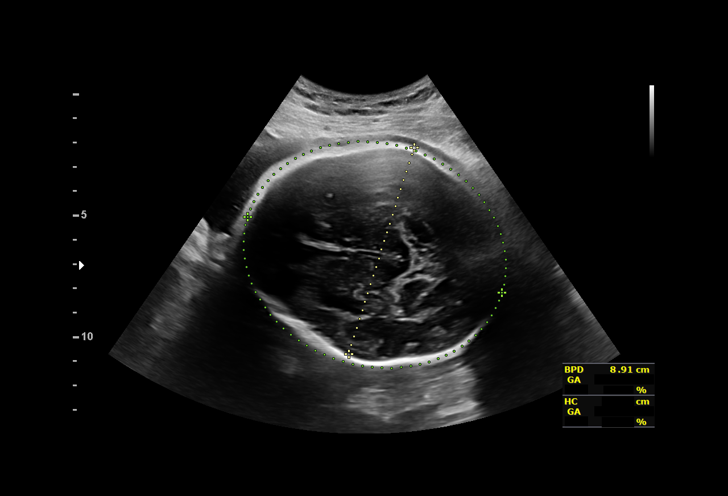
[im 5/38]
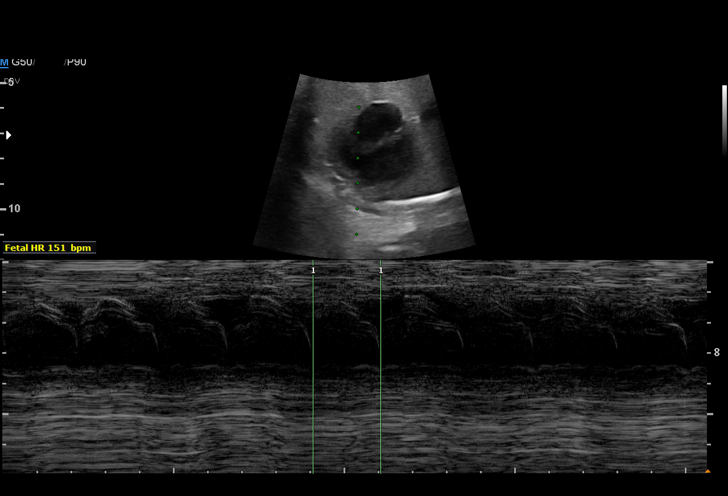
[im 7/38]
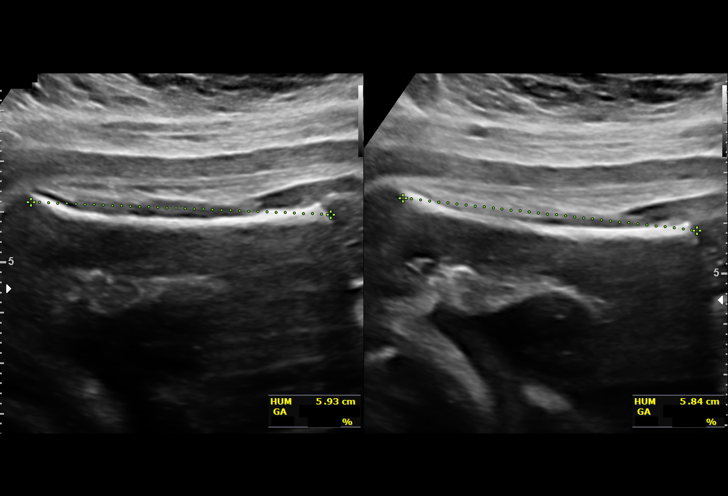
[im 10/38]
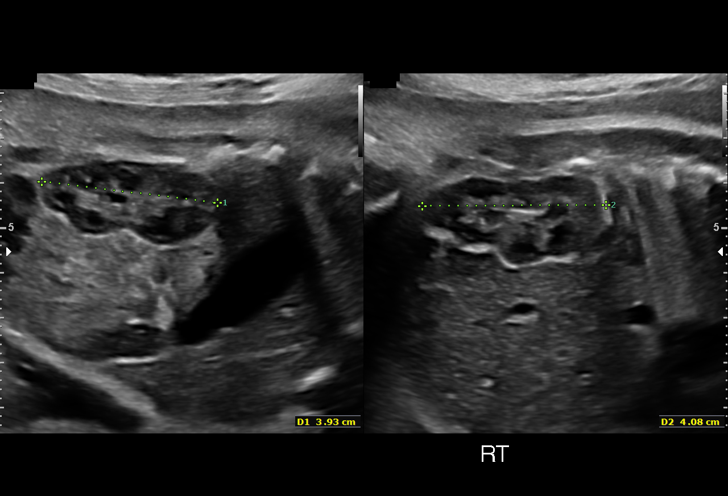
[im 13/38]
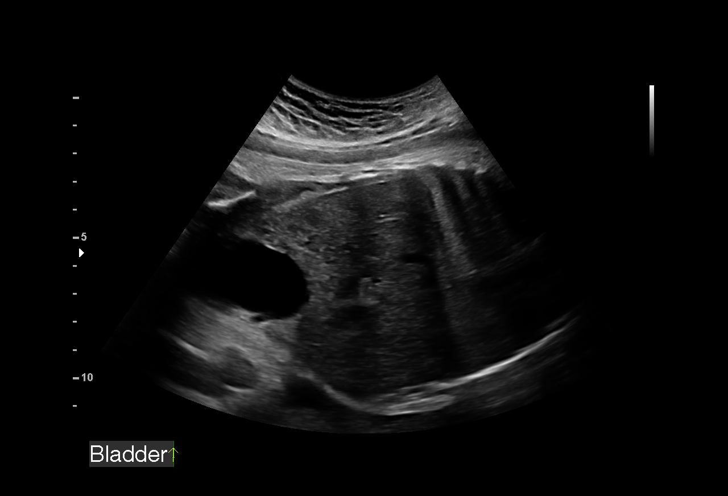
[im 16/38]
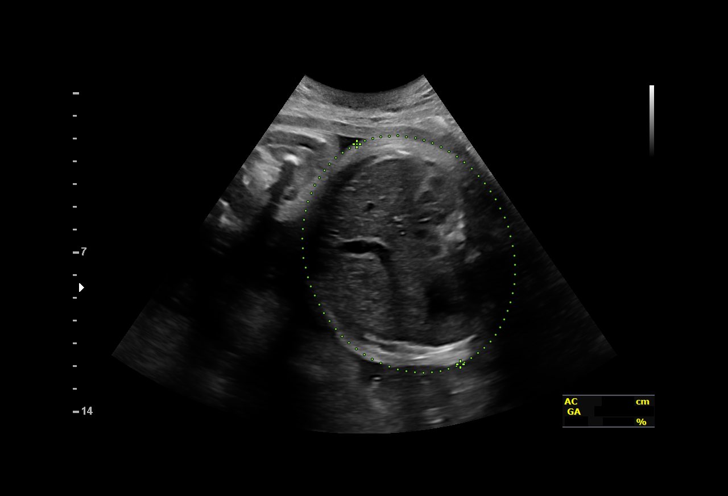
[im 20/38]
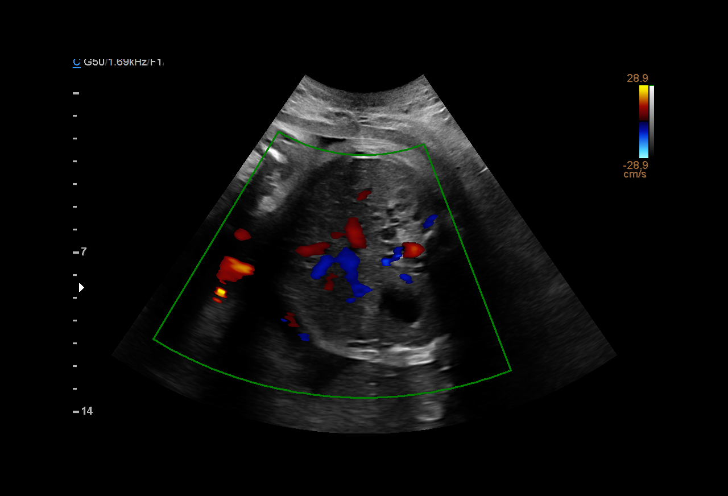
[im 22/38]
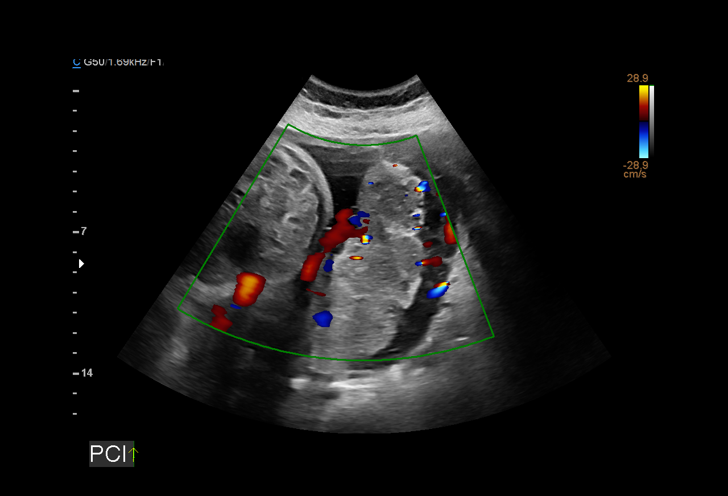
[im 25/38]
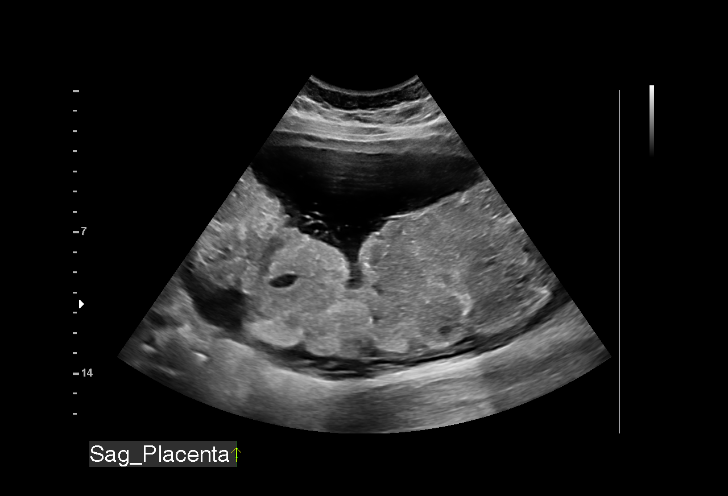
[im 28/38]
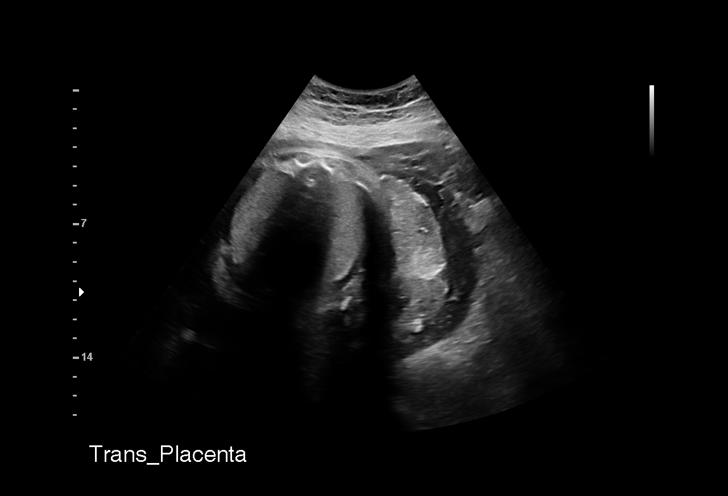
[im 31/38]
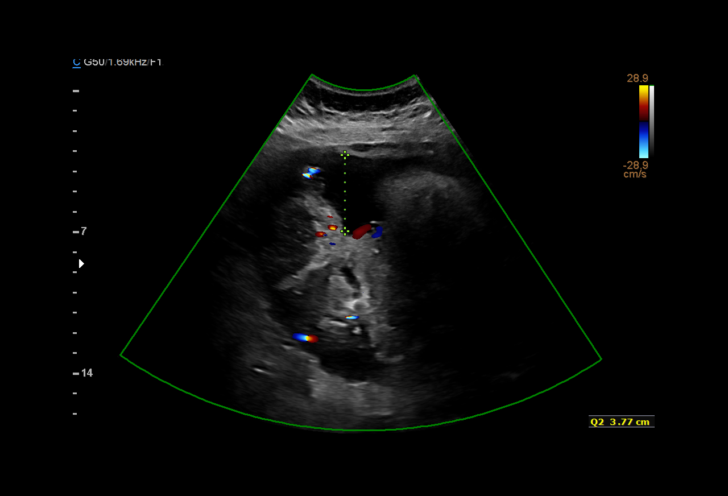
[im 33/38]
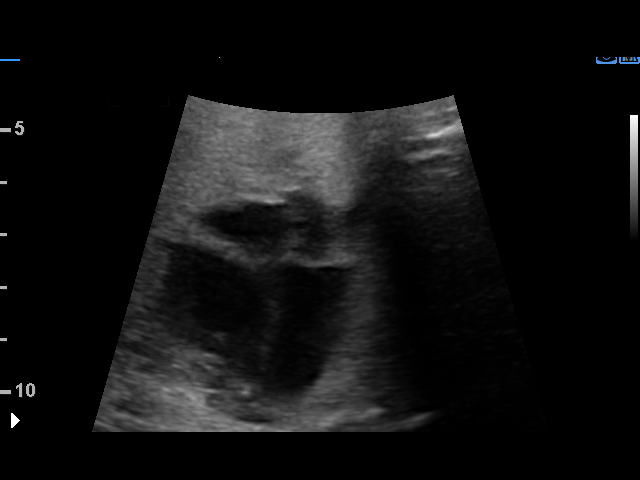
[im 36/38]
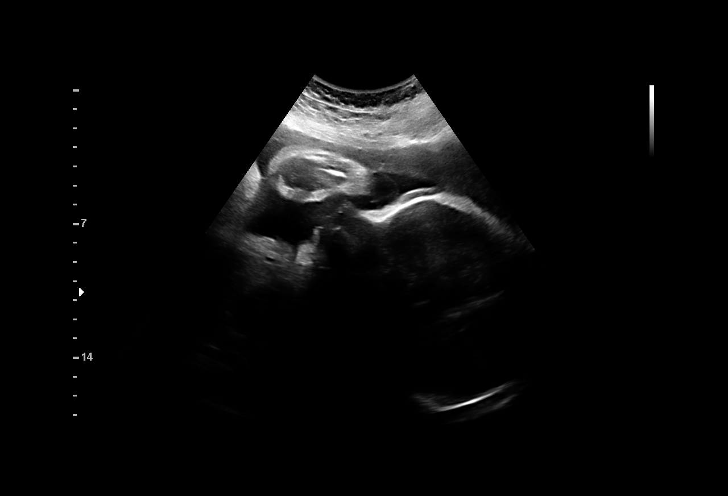

[13 of 28 positions shown; findings below may reference images not displayed]

[id number] [HOSP[facility]] [address]
                   MAX R CNM

Indications

 Umbilical vein abnormality complicating
 pregnancy
 34 weeks gestation of pregnancy
 Low Risk NIPS
 Encounter for other antenatal screening
 follow-up
Fetal Evaluation

 Num Of Fetuses:         1
 Fetal Heart Rate(bpm):  151
 Cardiac Activity:       Observed
 Presentation:           Cephalic
 Placenta:               Posterior Fundal
 P. Cord Insertion:      Visualized

 Amniotic Fluid
 AFI FV:      Within normal limits

 AFI Sum(cm)     %Tile       Largest Pocket(cm)
 14.9            54

 RUQ(cm)       RLQ(cm)       LUQ(cm)        LLQ(cm)
 4
Biometry

 BPD:      89.4  mm     G. Age:  36w 1d         87  %    CI:        80.14   %    70 - 86
                                                         FL/HC:      20.6   %    20.1 -
 HC:      315.5  mm     G. Age:  35w 3d         32  %    HC/AC:      1.02        0.93 -
 AC:      308.6  mm     G. Age:  34w 6d         59  %    FL/BPD:     72.8   %    71 - 87
 FL:       65.1  mm     G. Age:  33w 4d         15  %    FL/AC:      21.1   %    20 - 24
 HUM:        59  mm     G. Age:  34w 1d         53  %
 LV:        6.1  mm

 Est. FW:    8452  gm      5 lb 8 oz     44  %
OB History

 Gravidity:    1         Term:   0
 Living:       0
Gestational Age

 LMP:           35w 3d        Date:  01/11/19                 EDD:   10/18/19
 U/S Today:     35w 0d                                        EDD:   10/21/19
 Best:          34w 5d     Det. By:  Previous Ultrasound      EDD:   10/23/19
                                     (05/27/19)
Anatomy

 Cranium:               Appears normal         Aortic Arch:            Previously seen
 Cavum:                 Previously seen        Ductal Arch:            Previously seen
 Ventricles:            Appears normal         Diaphragm:              Previously seen
 Choroid Plexus:        Previously seen        Stomach:                Appears normal, left
                                                                       sided
 Cerebellum:            Previously seen        Abdomen:                Persistent right
                                                                       umbilical vein
 Posterior Fossa:       Previously seen        Abdominal Wall:         Previously seen
 Nuchal Fold:           Not applicable (>20    Cord Vessels:           Previously seen
                        wks GA)
 Face:                  Orbits and profile     Kidneys:                Appear normal
                        previously seen
 Lips:                  Previously seen        Bladder:                Appears normal
 Thoracic:              Appears normal         Spine:                  Previously seen
 Heart:                 Appears normal         Upper Extremities:      Previously seen
                        (4CH, axis, and
                        situs)
 RVOT:                  Previously seen        Lower Extremities:      Previously seen
 LVOT:                  Previously seen

 Other:  Female gender prev seen.  Technically difficult due to fetal position.
Cervix Uterus Adnexa

 Cervix
 Not visualized (advanced GA >14wks)
Impression

 Persistent right umbilical vein (PRUV).  No other fetal
 anomalies were seen on previous scans.  Patient does not
 have gestational diabetes.
 Fetal growth is appropriate for gestational age .Amniotic fluid
 is normal and good fetal activity is seen .PRUV is seen again.

 I reassured the patient of normal fetal growth.
Recommendations

 -Follow-up scans as clinically indicated.
 -Delivery at 39 weeks.
                 Clyburn, Konner

## 2021-06-30 ENCOUNTER — Encounter: Payer: Self-pay | Admitting: Nurse Practitioner

## 2021-06-30 ENCOUNTER — Ambulatory Visit: Payer: 59 | Admitting: Nurse Practitioner

## 2021-06-30 VITALS — BP 110/79 | HR 104 | Temp 97.9°F | Wt 157.8 lb

## 2021-06-30 DIAGNOSIS — J029 Acute pharyngitis, unspecified: Secondary | ICD-10-CM | POA: Diagnosis not present

## 2021-06-30 DIAGNOSIS — J020 Streptococcal pharyngitis: Secondary | ICD-10-CM

## 2021-06-30 LAB — POCT RAPID STREP A (OFFICE): Rapid Strep A Screen: NEGATIVE

## 2021-06-30 MED ORDER — CEPHALEXIN 500 MG PO CAPS: 500.0000 mg | ORAL_CAPSULE | Freq: Two times a day (BID) | ORAL | 0 refills | Status: AC

## 2021-06-30 NOTE — Progress Notes (Signed)
? ?Subjective:  ? ? Patient ID: Rachel Logan, female    DOB: 03-01-1996, 26 y.o.   MRN: [id number] ? ?HPI ? ?26 year old female with history of acid reflux, depression, anxiety presents to the clinic today with complaints of sore throat, fever, body aches x1 day.  Patient says she has 2 nephews that have been dx with strep throat. They have been on vacation together and drinking after each other.  She woke up yesterday with a sore throat, white spots in back of throat and has pus. ? ?Patient denies any nasal discharge, cough, ear pain, wheezing, shortness of breath. ? ? ?Review of Systems  ?Constitutional:  Positive for fever.  ?HENT:  Positive for sore throat.   ?All other systems reviewed and are negative. ? ?   ?Objective:  ? Physical Exam ?Constitutional:   ?   General: She is not in acute distress. ?   Appearance: She is well-developed and normal weight. She is not ill-appearing or toxic-appearing.  ?HENT:  ?   Head: Normocephalic and atraumatic.  ?   Right Ear: Tympanic membrane and ear canal normal.  ?   Left Ear: Tympanic membrane and ear canal normal.  ?   Mouth/Throat:  ?   Mouth: Mucous membranes are moist. No oral lesions.  ?   Pharynx: Oropharyngeal exudate and posterior oropharyngeal erythema present. No pharyngeal swelling or uvula swelling.  ?   Tonsils: Tonsillar exudate present. No tonsillar abscesses. 0 on the right. 0 on the left.  ?Eyes:  ?   Extraocular Movements:  ?   Right eye: Normal extraocular motion.  ?   Left eye: Normal extraocular motion.  ?   Conjunctiva/sclera: Conjunctivae normal.  ?   Pupils: Pupils are equal, round, and reactive to light.  ?Neck:  ?   Thyroid: No thyromegaly.  ?Cardiovascular:  ?   Rate and Rhythm: Normal rate and regular rhythm.  ?   Heart sounds: Normal heart sounds. No murmur heard. ?Pulmonary:  ?   Effort: Pulmonary effort is normal. No respiratory distress.  ?   Breath sounds: Normal breath sounds. No wheezing.  ?Musculoskeletal:  ?   Cervical back: Normal  range of motion and neck supple.  ?Lymphadenopathy:  ?   Cervical: Cervical adenopathy present.  ?Skin: ?   General: Skin is warm.  ?   Capillary Refill: Capillary refill takes less than 2 seconds.  ?Neurological:  ?   Mental Status: She is alert.  ?   Comments: Grossly intact  ?Psychiatric:     ?   Mood and Affect: Mood normal.     ?   Behavior: Behavior normal.  ? ? ? ? ? ?   ?Assessment & Plan:  ? ?1. Strep throat ?-Point-of-care testing for strep throat was negative. ?-However given patient's current symptoms and the fact that she was exposed by 2 known exposures we will go ahead and treat patient prophylactically for strep throat. ?-Patient allergic to penicillin.  We will treat with cephalosporin.  Patient educated to monitor for side effects as there may be a possible cross sensitivity between cephalosporins and penicillins.  Patient stated understanding ?- POCT rapid strep A ?- Culture, Group A Strep, pending ?- cephALEXin (KEFLEX) 500 MG capsule; Take 1 capsule (500 mg total) by mouth 2 (two) times daily for 10 days.  Dispense: 20 capsule; Refill: 0 ?-Return to clinic in 2 to 3 days if symptoms are not better with 2 to 3 days of antibiotics or if symptoms worsen. ? ?  ?  Note:  This document was prepared using Dragon voice recognition software and may include unintentional dictation errors. ? ? ?

## 2021-07-04 LAB — CULTURE, GROUP A STREP

## 2021-07-28 DIAGNOSIS — Z3200 Encounter for pregnancy test, result unknown: Secondary | ICD-10-CM | POA: Diagnosis not present

## 2021-07-28 DIAGNOSIS — Z3689 Encounter for other specified antenatal screening: Secondary | ICD-10-CM | POA: Diagnosis not present

## 2021-09-02 DIAGNOSIS — Z3201 Encounter for pregnancy test, result positive: Secondary | ICD-10-CM | POA: Diagnosis not present

## 2021-09-13 DIAGNOSIS — Z3481 Encounter for supervision of other normal pregnancy, first trimester: Secondary | ICD-10-CM | POA: Diagnosis not present

## 2021-09-13 DIAGNOSIS — Z3480 Encounter for supervision of other normal pregnancy, unspecified trimester: Secondary | ICD-10-CM | POA: Diagnosis not present

## 2021-09-13 DIAGNOSIS — Z3A10 10 weeks gestation of pregnancy: Secondary | ICD-10-CM | POA: Diagnosis not present

## 2021-09-13 DIAGNOSIS — O218 Other vomiting complicating pregnancy: Secondary | ICD-10-CM | POA: Diagnosis not present

## 2021-09-13 DIAGNOSIS — O359XX9 Maternal care for (suspected) fetal abnormality and damage, unspecified, other fetus: Secondary | ICD-10-CM | POA: Diagnosis not present

## 2021-09-13 DIAGNOSIS — Z3689 Encounter for other specified antenatal screening: Secondary | ICD-10-CM | POA: Diagnosis not present

## 2021-09-13 DIAGNOSIS — Z36 Encounter for antenatal screening for chromosomal anomalies: Secondary | ICD-10-CM | POA: Diagnosis not present

## 2021-09-13 LAB — OB RESULTS CONSOLE HEPATITIS B SURFACE ANTIGEN: Hepatitis B Surface Ag: NEGATIVE

## 2021-09-13 LAB — OB RESULTS CONSOLE GC/CHLAMYDIA
Chlamydia: NEGATIVE
Neisseria Gonorrhea: NEGATIVE

## 2021-09-13 LAB — OB RESULTS CONSOLE RPR: RPR: NONREACTIVE

## 2021-09-13 LAB — OB RESULTS CONSOLE HIV ANTIBODY (ROUTINE TESTING): HIV: NONREACTIVE

## 2021-09-13 LAB — OB RESULTS CONSOLE RUBELLA ANTIBODY, IGM: Rubella: IMMUNE

## 2021-11-17 DIAGNOSIS — Z3481 Encounter for supervision of other normal pregnancy, first trimester: Secondary | ICD-10-CM | POA: Diagnosis not present

## 2021-11-17 DIAGNOSIS — Z3482 Encounter for supervision of other normal pregnancy, second trimester: Secondary | ICD-10-CM | POA: Diagnosis not present

## 2021-11-17 DIAGNOSIS — Z363 Encounter for antenatal screening for malformations: Secondary | ICD-10-CM | POA: Diagnosis not present

## 2021-11-17 DIAGNOSIS — Z3A19 19 weeks gestation of pregnancy: Secondary | ICD-10-CM | POA: Diagnosis not present

## 2021-11-17 DIAGNOSIS — Z0183 Encounter for blood typing: Secondary | ICD-10-CM | POA: Diagnosis not present

## 2021-12-29 DIAGNOSIS — Z3482 Encounter for supervision of other normal pregnancy, second trimester: Secondary | ICD-10-CM | POA: Diagnosis not present

## 2021-12-29 DIAGNOSIS — Z3483 Encounter for supervision of other normal pregnancy, third trimester: Secondary | ICD-10-CM | POA: Diagnosis not present

## 2022-01-14 DIAGNOSIS — O360130 Maternal care for anti-D [Rh] antibodies, third trimester, not applicable or unspecified: Secondary | ICD-10-CM | POA: Diagnosis not present

## 2022-01-14 DIAGNOSIS — Z3482 Encounter for supervision of other normal pregnancy, second trimester: Secondary | ICD-10-CM | POA: Diagnosis not present

## 2022-01-14 DIAGNOSIS — Z3689 Encounter for other specified antenatal screening: Secondary | ICD-10-CM | POA: Diagnosis not present

## 2022-01-14 DIAGNOSIS — Z8759 Personal history of other complications of pregnancy, childbirth and the puerperium: Secondary | ICD-10-CM | POA: Diagnosis not present

## 2022-01-14 DIAGNOSIS — Z3A27 27 weeks gestation of pregnancy: Secondary | ICD-10-CM | POA: Diagnosis not present

## 2022-02-14 DIAGNOSIS — Z23 Encounter for immunization: Secondary | ICD-10-CM | POA: Diagnosis not present

## 2022-02-14 DIAGNOSIS — Z3482 Encounter for supervision of other normal pregnancy, second trimester: Secondary | ICD-10-CM | POA: Diagnosis not present

## 2022-03-04 DIAGNOSIS — O26843 Uterine size-date discrepancy, third trimester: Secondary | ICD-10-CM | POA: Diagnosis not present

## 2022-03-04 DIAGNOSIS — Z3A35 35 weeks gestation of pregnancy: Secondary | ICD-10-CM | POA: Diagnosis not present

## 2022-03-07 NOTE — L&D Delivery Note (Signed)
      Delivery Note:   G3P1001 at [redacted]w[redacted]d Admitting diagnosis: Normal labor [O80, Z37.9] Risks: Suspected LGA at 98% at 35 weeks. History of PPH with G1 of 10013m    First Stage:  Induction of labor:Augmentation for advanced dilation with history of rapid labors.  Onset of labor: 1944 Augmentation: AROM ROM: 1944 Active labor onset: 1944 Hydrotherapy: time 48 minutes Analgesia /Anesthesia/Pain control intrapartum: Local   Second Stage:  Complete dilation at 04/04/2022  2118 Onset of pushing at 2118 FHR second stage reassuring via intermittent monitoring   Pushing in hands and knees position with CNM, SNM and L&D staff support, spouse, HaLazarus Salinesnd doula, JeJanett Billowresent for birth and supportive. Nuchal Cord: No  Delivery of a Live born female  Birth Weight:  9 lbs 8 oz APGAR: 8, 9  Newborn Delivery   Birth date/time: 04/04/2022 21:27:00 Delivery type:       in cephalic presentation, position OA to ROA. Shoulders and body delivered easily Newborn with lusty cry, good tone, dried and stimulated, placed on maternal abdomen.  Cord double clamped after cessation of pulsation, [facility] of [facility] blood for typing completed. Cord blood donation-None  Arterial cord blood sample-No    Third Stage:  Placenta delivered 04/05/2022 at 2133-Spontaneous  with 3 vessels . Uterine tone firm, bleeding scant. Uterotonics: 10 units IM pit Placenta to L&D.  1st degree;Perineal  laceration identified.  Episiotomy:None  Local analgesia: 1 % lidocaine  Repair: First degree perineal laceration noted. Repair completed with 3.0 vicryl in the usual fashion with good approximation and hemostasis noted.  Est. Blood Loss (mLAE):497.53 Complications: None   Mom to postpartum.  BaBlane Oharao Couplet care / Skin to Skin.  Delivery Report:   Review the Delivery Report for details.     Signed: SaConan BowensSNM 04/05/2022, 12:14 AM

## 2022-03-09 DIAGNOSIS — Z3685 Encounter for antenatal screening for Streptococcus B: Secondary | ICD-10-CM | POA: Diagnosis not present

## 2022-03-09 DIAGNOSIS — Z3480 Encounter for supervision of other normal pregnancy, unspecified trimester: Secondary | ICD-10-CM | POA: Diagnosis not present

## 2022-03-09 DIAGNOSIS — Z3483 Encounter for supervision of other normal pregnancy, third trimester: Secondary | ICD-10-CM | POA: Diagnosis not present

## 2022-03-09 LAB — OB RESULTS CONSOLE GBS: GBS: NEGATIVE

## 2022-04-04 ENCOUNTER — Encounter (HOSPITAL_COMMUNITY): Payer: Self-pay | Admitting: Obstetrics and Gynecology

## 2022-04-04 ENCOUNTER — Inpatient Hospital Stay (HOSPITAL_COMMUNITY)
Admission: AD | Admit: 2022-04-04 | Discharge: 2022-04-06 | DRG: 807 | Disposition: A | Discharge status: Home / Self Care | Payer: 59 | Attending: Obstetrics and Gynecology | Admitting: Obstetrics and Gynecology

## 2022-04-04 DIAGNOSIS — Z6791 Unspecified blood type, Rh negative: Secondary | ICD-10-CM | POA: Diagnosis not present

## 2022-04-04 DIAGNOSIS — O3663X0 Maternal care for excessive fetal growth, third trimester, not applicable or unspecified: Principal | ICD-10-CM | POA: Diagnosis present

## 2022-04-04 DIAGNOSIS — O26893 Other specified pregnancy related conditions, third trimester: Secondary | ICD-10-CM | POA: Diagnosis not present

## 2022-04-04 DIAGNOSIS — Z3A39 39 weeks gestation of pregnancy: Secondary | ICD-10-CM | POA: Diagnosis not present

## 2022-04-04 DIAGNOSIS — F419 Anxiety disorder, unspecified: Secondary | ICD-10-CM | POA: Diagnosis present

## 2022-04-04 DIAGNOSIS — F32A Depression, unspecified: Secondary | ICD-10-CM | POA: Diagnosis present

## 2022-04-04 LAB — HIV ANTIBODY (ROUTINE TESTING W REFLEX): HIV Screen 4th Generation wRfx: NONREACTIVE

## 2022-04-04 LAB — CBC
HCT: 35.1 % — ABNORMAL LOW (ref 36.0–46.0)
Hemoglobin: 11.7 g/dL — ABNORMAL LOW (ref 12.0–15.0)
MCH: 28.4 pg (ref 26.0–34.0)
MCHC: 33.3 g/dL (ref 30.0–36.0)
MCV: 85.2 fL (ref 80.0–100.0)
Platelets: 188 10*3/uL (ref 150–400)
RBC: 4.12 MIL/uL (ref 3.87–5.11)
RDW: 12.7 % (ref 11.5–15.5)
WBC: 14.1 10*3/uL — ABNORMAL HIGH (ref 4.0–10.5)
nRBC: 0 % (ref 0.0–0.2)

## 2022-04-04 LAB — TYPE AND SCREEN
ABO/RH(D): O NEG
Antibody Screen: POSITIVE

## 2022-04-04 MED ORDER — OXYTOCIN 10 UNIT/ML IJ SOLN
INTRAMUSCULAR | Status: AC
  Administered 2022-04-04: 10 [IU] via INTRAMUSCULAR
  Filled 2022-04-04: qty 1

## 2022-04-04 MED ORDER — ONDANSETRON HCL 4 MG/2ML IJ SOLN: 4.0000 mg | INTRAMUSCULAR | Status: DC | PRN

## 2022-04-04 MED ORDER — SIMETHICONE 80 MG PO CHEW: 80.0000 mg | CHEWABLE_TABLET | ORAL | Status: DC | PRN

## 2022-04-04 MED ORDER — IBUPROFEN 600 MG PO TABS
600.0000 mg | ORAL_TABLET | Freq: Four times a day (QID) | ORAL | Status: DC
  Administered 2022-04-05 – 2022-04-06 (×7): 600 mg via ORAL
  Filled 2022-04-04 (×7): qty 1

## 2022-04-04 MED ORDER — PRENATAL MULTIVITAMIN CH
1.0000 | ORAL_TABLET | Freq: Every day | ORAL | Status: DC
  Administered 2022-04-05 – 2022-04-06 (×2): 1 via ORAL
  Filled 2022-04-04 (×2): qty 1

## 2022-04-04 MED ORDER — OXYTOCIN 10 UNIT/ML IJ SOLN: 10.0000 [IU] | Freq: Once | INTRAMUSCULAR | Status: AC

## 2022-04-04 MED ORDER — BENZOCAINE-MENTHOL 20-0.5 % EX AERO
1.0000 | INHALATION_SPRAY | CUTANEOUS | Status: DC | PRN
  Administered 2022-04-06: 1 via TOPICAL
  Filled 2022-04-04: qty 56

## 2022-04-04 MED ORDER — DIBUCAINE (PERIANAL) 1 % EX OINT: 1.0000 | TOPICAL_OINTMENT | CUTANEOUS | Status: DC | PRN

## 2022-04-04 MED ORDER — OXYTOCIN BOLUS FROM INFUSION: 333.0000 mL | Freq: Once | INTRAVENOUS | Status: DC

## 2022-04-04 MED ORDER — ACETAMINOPHEN 500 MG PO TABS: 1000.0000 mg | ORAL_TABLET | Freq: Four times a day (QID) | ORAL | Status: DC | PRN

## 2022-04-04 MED ORDER — ONDANSETRON HCL 4 MG PO TABS: 4.0000 mg | ORAL_TABLET | ORAL | Status: DC | PRN

## 2022-04-04 MED ORDER — SODIUM CHLORIDE 0.9 % IV SOLN: INTRAVENOUS | Status: DC | PRN

## 2022-04-04 MED ORDER — LACTATED RINGERS IV SOLN: INTRAVENOUS | Status: DC

## 2022-04-04 MED ORDER — ONDANSETRON HCL 4 MG/2ML IJ SOLN: 4.0000 mg | Freq: Four times a day (QID) | INTRAMUSCULAR | Status: DC | PRN

## 2022-04-04 MED ORDER — SODIUM CHLORIDE 0.9% FLUSH: 3.0000 mL | Freq: Two times a day (BID) | INTRAVENOUS | Status: DC

## 2022-04-04 MED ORDER — SOD CITRATE-CITRIC ACID 500-334 MG/5ML PO SOLN: 30.0000 mL | ORAL | Status: DC | PRN

## 2022-04-04 MED ORDER — TETANUS-DIPHTH-ACELL PERTUSSIS 5-2.5-18.5 LF-MCG/0.5 IM SUSY: 0.5000 mL | PREFILLED_SYRINGE | Freq: Once | INTRAMUSCULAR | Status: DC

## 2022-04-04 MED ORDER — LIDOCAINE HCL (PF) 1 % IJ SOLN
30.0000 mL | INTRAMUSCULAR | Status: AC | PRN
  Administered 2022-04-04: 30 mL via SUBCUTANEOUS
  Filled 2022-04-04: qty 30

## 2022-04-04 MED ORDER — ACETAMINOPHEN 325 MG PO TABS: 650.0000 mg | ORAL_TABLET | ORAL | Status: DC | PRN

## 2022-04-04 MED ORDER — OXYTOCIN-SODIUM CHLORIDE 30-0.9 UT/500ML-% IV SOLN
2.5000 [IU]/h | INTRAVENOUS | Status: DC
  Filled 2022-04-04: qty 500

## 2022-04-04 MED ORDER — COCONUT OIL OIL
1.0000 | TOPICAL_OIL | Status: DC | PRN
  Administered 2022-04-05: 1 via TOPICAL

## 2022-04-04 MED ORDER — DIPHENHYDRAMINE HCL 25 MG PO CAPS: 25.0000 mg | ORAL_CAPSULE | Freq: Four times a day (QID) | ORAL | Status: DC | PRN

## 2022-04-04 MED ORDER — SODIUM CHLORIDE 0.9% FLUSH: 3.0000 mL | INTRAVENOUS | Status: DC | PRN

## 2022-04-04 MED ORDER — ZOLPIDEM TARTRATE 5 MG PO TABS: 5.0000 mg | ORAL_TABLET | Freq: Every evening | ORAL | Status: DC | PRN

## 2022-04-04 MED ORDER — WITCH HAZEL-GLYCERIN EX PADS: 1.0000 | MEDICATED_PAD | CUTANEOUS | Status: DC | PRN

## 2022-04-04 MED ORDER — FENTANYL CITRATE (PF) 100 MCG/2ML IJ SOLN: 50.0000 ug | INTRAMUSCULAR | Status: DC | PRN

## 2022-04-04 MED ORDER — SENNOSIDES-DOCUSATE SODIUM 8.6-50 MG PO TABS
2.0000 | ORAL_TABLET | Freq: Every day | ORAL | Status: DC
  Administered 2022-04-05: 2 via ORAL
  Filled 2022-04-04 (×2): qty 2

## 2022-04-04 MED ORDER — LACTATED RINGERS IV SOLN: 500.0000 mL | INTRAVENOUS | Status: DC | PRN

## 2022-04-04 NOTE — H&P (Incomplete)
OB ADMISSION/ HISTORY & PHYSICAL:  Admission Date: 04/04/2022  6:18 PM  Admit Diagnosis: Normal labor [O80, Z37.9]    Rachel Logan is a 27 y.o. female G3P1001 at 58w3dpresenting for augmentation of labor at term with advanced dilation and history of rapid labor. Spouse, Rachel Logan and supportive. Expecting baby Rachel Logan   Prenatal History: G3P1001   EDC: 04/08/2022, by Other Basis  [facility] days.   Primary:   Prenatal course complicated by: Suspected LGA in 98% at 35 weeks.  History of PPH of 100108m  Rh negative status, Rhogam at 28 weeks.    Prenatal Labs: ABO, Rh:  O negative Antibody: POS (01/29 1850) Rubella:  Nonimmune RPR:   Nonreactive HBsAg:  Negative HIV: Non Reactive (01/29 1850)  GBS:    1 hr Glucola : 108 Genetic Screening: Low risk panorama Ultrasound: WNL, normal XX anatomy    Maternal Diabetes: No Genetic Screening: Normal Maternal Ultrasounds/Referrals: Normal Fetal Ultrasounds or other Referrals:  None Maternal Substance Abuse:  No Significant Maternal Medications:  None Significant Maternal Lab Results:  Group B Strep negative and Rh negative Other Comments:  None  Medical / Surgical History : Past medical history:  Past Medical History:  Diagnosis Date  . Acid reflux   . Anorexia   . Anxiety   . Depression     Past surgical history:  Past Surgical History:  Procedure Laterality Date  . labiaplasty Bilateral June 2016    Family History:  Family History  Problem Relation Age of Onset  . Leukemia Maternal Grandmother   . Liver cancer Maternal Grandfather   . Heart disease Paternal Grandfather     Social History:  reports that she has never smoked. She has never used smokeless tobacco. She reports that she does not drink alcohol and does not use drugs.  Allergies: Bee venom and Penicillins   Current Medications at time of admission:  Medications Prior to Admission  Medication Sig  Dispense Refill Last Dose  . Prenatal Vit-Fe Fumarate-FA (PRENATAL COMPLETE PO) Take by mouth.   04/03/2022 at 2000     Review of Systems: Review of Systems  All other systems reviewed and are negative.   Physical Exam: Vital signs and nursing notes reviewed.  Patient Vitals for the past 24 hrs:  BP Temp Temp src Pulse Resp Height Weight  04/04/22 2327 117/82 98 F (36.7 C) -- 92 16 -- --  04/04/22 2301 117/77 -- -- 92 -- -- --  04/04/22 2246 118/77 -- -- 82 -- -- --  04/04/22 2231 127/81 -- -- 77 -- -- --  04/04/22 2216 129/80 -- -- 90 -- -- --  04/04/22 2209 127/80 -- -- 88 -- -- --  04/04/22 2000 (!) 141/94 -- -- (!) 120 16 -- --  04/04/22 1858 -- -- -- -- -- '5\' 7"'$  (1.702 m) 94.1 kg  04/04/22 1841 122/79 97.9 F (36.6 C) Oral (!) 105 16 -- --     General: AAO x 3, NAD Heart: RRR Lungs:CTAB Abdomen: Gravid, NT Extremities: no edema SVE: Dilation: 10 Dilation Complete Date: 04/04/22 Dilation Complete Time: 2118 Effacement (%): 80 Station: -3, -1 Presentation: Vertex Exam by:: Rachel Logan (CNM Student)   FHR: 125 BPM, moderate variability, positive accels, occasional variable decels noted TOCO: Contractions regular  Labs:   Recent Labs    04/04/22 1850  WBC 14.1*  HGB 11.7*  HCT 35.1*  PLT 188    Assessment/Plan:  27 y.o. G3P1001 at [redacted]w[redacted]d  Fetal wellbeing - FHT category 1 EFW LGA,  98% at 35 week  Labor: Plan AROM to augment advanced dilation and anticipate labor to follow soon after.   GBS negative Rubella nonimmune Rh negative   Pain control: desires unmedicated labor and birth Analgesia/anesthesia PRN  Anticipated MOD: NVSB  Plans to breastfeed.  POC discussed with patient and support team, all questions answered.  Rachel TRonita Hippsnotified of admission/plan of care.  Rachel Logan

## 2022-04-04 NOTE — H&P (Incomplete)
OB ADMISSION/ HISTORY & PHYSICAL:  Admission Date: 04/04/2022  6:18 PM  Admit Diagnosis: Normal labor [O80, Z37.9]    Rachel Logan is a 27 y.o. female G3P1001 at 46w3dpresenting for augmentation of labor at term with advanced dilation and history of rapid labor. Spouse, HLazarus Salinespresent and supportive. Expecting baby [name]   Prenatal History: G3P1001   EDC: 04/08/2022, by Other Basis  [facility] days[facility]   Primary:   Prenatal course complicated by: Suspected LGA in 98% at 35 weeks.  History of PPH of 10061m  Rh negative status, Rhogam at 28 weeks. Rubella nonimmune.    Prenatal Labs: ABO, Rh:  O negative Antibody: POS (01/29 1850) Rubella:  Nonimmune RPR:   Nonreactive HBsAg:  Negative HIV: Non Reactive (01/29 1850)  GBS:   Negative 1 hr Glucola : 108 Genetic Screening: Low risk panorama XX Ultrasound: WNL, normal XX anatomy    Maternal Diabetes: No Genetic Screening: Normal Maternal Ultrasounds/Referrals: Normal Fetal Ultrasounds or other Referrals:  None Maternal Substance Abuse:  No Significant Maternal Medications:  None Significant Maternal Lab Results:  Group B Strep negative and Rh negative Other Comments:  None  Medical / Surgical History : Past medical history:  Past Medical History:  Diagnosis Date   Acid reflux    Anorexia    Anxiety    Depression     Past surgical history:  Past Surgical History:  Procedure Laterality Date   labiaplasty Bilateral June 2016    Family History:  Family History  Problem Relation Age of Onset   Leukemia Maternal Grandmother    Liver cancer Maternal Grandfather    Heart disease Paternal Grandfather     Social History:  reports that she has never smoked. She has never used smokeless tobacco. She reports that she does not drink alcohol and does not use drugs.  Allergies: Bee venom and Penicillins   Current Medications at time of admission:  Medications Prior to  Admission  Medication Sig Dispense Refill Last Dose   Prenatal Vit-Fe Fumarate-FA (PRENATAL COMPLETE PO) Take by mouth.   04/03/2022 at 2000    Review of Systems: Review of Systems  All other systems reviewed and are negative.  Physical Exam: Vital signs and nursing notes reviewed.  Patient Vitals for the past 24 hrs:  BP Temp Temp src Pulse Resp Height Weight  04/04/22 2327 117/82 98 F (36.7 C) -- 92 16 -- --  04/04/22 2301 117/77 -- -- 92 -- -- --  04/04/22 2246 118/77 -- -- 82 -- -- --  04/04/22 2231 127/81 -- -- 77 -- -- --  04/04/22 2216 129/80 -- -- 90 -- -- --  04/04/22 2209 127/80 -- -- 88 -- -- --  04/04/22 2000 (!) 141/94 -- -- (!) 120 16 -- --  04/04/22 1858 -- -- -- -- -- '5\' 7"'$  (1.702 m) 94.1 kg  04/04/22 1841 122/79 97.9 F (36.6 C) Oral (!) 105 16 -- --    General: AAO x 3, NAD Heart: RRR Lungs:CTAB Abdomen: Gravid, NT Extremities: no edema SVE: Dilation: 6 Effacement (%): 80 Station: -1 Presentation: Vertex Exam by:: SaTruett Mainlandill (CNM Student)   FHR: 125 BPM, moderate variability, positive accels, occasional variable decels noted TOCO: Contractions irregular  Labs:   Recent Labs    04/04/22 1850  WBC 14.1*  HGB 11.7*  HCT 35.1*  PLT 188   Assessment/Plan: [age].o. G3P1001 at 3937w3ddvanced cervical dilatation and history  of precipitous active stage, admitted for augmentation with AROM  Fetal wellbeing - FHT category 1 EFW LGA, 98% at 35 week  Labor: Plan AROM to augment advanced dilation and anticipate labor to follow soon after.   GBS negative Rubella nonimmune Rh negative   Pain control: desires unmedicated labor and birth, desires hydrotherapy Analgesia/anesthesia PRN  Anticipated MOD: NVSB  Plans to breastfeed.  POC discussed with patient and support team, all questions answered.  Dr Ronita Hipps notified of admission/plan of care.  Conan Bowens SNM    Medical screening examination/treatment/procedure(s) were conducted as a  shared visit with non-physician practitioner(s) and myself.  I personally evaluated the patient during the encounter.   Suzan Nailer, CNM, MSN 04/05/2022, 12:13 AM

## 2022-04-05 DIAGNOSIS — Z6791 Unspecified blood type, Rh negative: Secondary | ICD-10-CM | POA: Diagnosis present

## 2022-04-05 LAB — CBC
HCT: 31.4 % — ABNORMAL LOW (ref 36.0–46.0)
Hemoglobin: 10.8 g/dL — ABNORMAL LOW (ref 12.0–15.0)
MCH: 29 pg (ref 26.0–34.0)
MCHC: 34.4 g/dL (ref 30.0–36.0)
MCV: 84.4 fL (ref 80.0–100.0)
Platelets: 162 10*3/uL (ref 150–400)
RBC: 3.72 MIL/uL — ABNORMAL LOW (ref 3.87–5.11)
RDW: 12.8 % (ref 11.5–15.5)
WBC: 17.8 10*3/uL — ABNORMAL HIGH (ref 4.0–10.5)
nRBC: 0 % (ref 0.0–0.2)

## 2022-04-05 LAB — RPR: RPR Ser Ql: NONREACTIVE

## 2022-04-05 MED ORDER — RHO D IMMUNE GLOBULIN 1500 UNIT/2ML IJ SOSY
300.0000 ug | PREFILLED_SYRINGE | Freq: Once | INTRAMUSCULAR | Status: AC
  Administered 2022-04-05: 300 ug via INTRAVENOUS
  Filled 2022-04-05: qty 2

## 2022-04-05 NOTE — Social Work (Signed)
MOB was referred for history of depression/anxiety.  * Referral screened out by Clinical Social Worker because none of the following criteria appear to apply:  ~ History of anxiety/depression during this pregnancy, or of post-partum depression following prior delivery.  ~ Diagnosis of anxiety and/or depression within last 3 years OR * MOB's symptoms currently being treated with medication and/or therapy.  Per chart review MOB diagnosis dates back to 2018, No new symptoms noted in recent OB records.   Please contact the Clinical Social Worker if needs arise, by [facility] request, or if MOB scores greater than 9/yes to question 10 on [place] Postpartum Depression Screen.  Letta Kocher, [facility] Social Worker [phone]

## 2022-04-05 NOTE — Progress Notes (Signed)
   PPD #1 S/P NSVD  Live born female  Birth Weight: 9 lb 8 oz (4309 g) APGAR: 8, 9  Newborn Delivery   Birth date/time: 04/04/2022 21:27:00 Delivery type: Vaginal, Spontaneous     Baby name: Lanelle Bal  Delivering provider: Gavin Potters K   Lacerations: 1st degree;Perineal   Feeding: breast  Pain control at delivery: Local   S:  Reports feeling well.              Tolerating PO/No nausea or vomiting             Bleeding is light             Pain controlled with acetaminophen and ibuprofen (OTC)             Up ad lib/ambulatory/voiding without difficulties   O:  A & O x 3, in no apparent distress  Vitals:   04/04/22 2327 04/05/22 0058 04/05/22 0459 04/05/22 0845  BP: 117/82 111/73 115/81 107/73  Pulse: 92 96 72 80  Resp: '16 20 15 18  '$ Temp: 98 F (36.7 C) 97.8 F (36.6 C) 98.3 F (36.8 C) 98.4 F (36.9 C)  TempSrc:    Oral  SpO2: 99% 97% 100% 99%  Weight:      Height:       Recent Labs    04/04/22 1850 04/05/22 0537  WBC 14.1* 17.8*  HGB 11.7* 10.8*  HCT 35.1* 31.4*  PLT 188 162    Blood type: --/--/O NEG (01/29 1850)  Rubella: Immune (07/10 0000)   I&O: I/O last 3 completed shifts: In: -  Out: 172 [Blood:172]          No intake/output data recorded.  Gen: AAO x 3, NAD Abdomen: soft, non-tender, non-distended Fundus: firm, non-tender, U-1 Perineum: repair intact Lochia: small Extremities: no edema, no calf pain or tenderness   A/P:  PPD # 1 27 y.o., [id number]  Principal Problem:   Postpartum care following vaginal delivery 1/29  Doing well - stable status  Routine post partum orders Active Problems:   Anxiety and depression  Stable off meds  Plan close PP F/U for mood   Normal labor   SVD (spontaneous vaginal delivery)   First degree perineal laceration  Discussed perineal care and comfort measures.    RhD negative/Baby Rh positive  Plan Rhogam prior to discharge  Anticipate discharge tomorrow.   Suzan Nailer, MSN, CNM 04/05/2022, 12:05  PM

## 2022-04-05 NOTE — Lactation Note (Signed)
This note was copied from a baby's chart. Lactation Consultation Note  Patient Name: Rachel Logan OACZY'S Date: 04/05/2022   Age:28 hours   LC Note:  RN requested latch assistance.  When I arrived, mother reported that she was able to get baby latched after she had spit up some amniotic fluid.  Baby was asleep in her arms; mother felt comfortable with her latch.  Suggested she call for a latch observation with the next feed.     Maternal Data    Feeding    LATCH Score                    Lactation Tools Discussed/Used    Interventions    Discharge    Consult Status      Rachel Logan 04/05/2022, 7:49 AM

## 2022-04-05 NOTE — Lactation Note (Signed)
This note was copied from a baby's chart. Lactation Consultation Note  Patient Name: Rachel Logan TKPTW'S Date: 04/05/2022 Age : 28 hours old  Reason for consult: Follow-up assessment;Term;Infant weight loss;Breastfeeding assistance;Nipple pain/trauma (2 % weight loss,) Per mom mentioned she is having sore nipples, using coconut oil and hand pump.  LC offered to assess , mom receptive and assistance with latch.  Both nipples pinky red, no breakdown, with areola edema.  LC noted baby to have a significant recessed chin.  LC assisted to latch the baby in the football position left breast.  After several attempts baby latched with depth, swallows increased with breast compressions and warm compress. Initially discomfort per mom and improved for baby to stay latched for 15 mins. Nipple slightly slanted when baby released. Latch score 8  Baby noted to be stuffy, snorty and the Breast feeding helped.   LC plan due to sore nipples and semi compressible areolas:  Breast shells between feedings except when sleeping ,  Comfort gels after feedings , and when warm , rinse with warm water , place in holders and back in the refrigerator.  Coconut oil after comfort gels .  Steps for latching:  Breast massage, hand express, pre-pump 10 -20 strokes and  Reverse pressure stretch back as shown ,  Firm support for latching ( probably football position as shown )  When latching, wait for wide open mouth and then latch.    Maternal Data Has patient been taught Hand Expression?: Yes Does the patient have breastfeeding experience prior to this delivery?: Yes How long did the patient breastfeed?: per mom with her baby it was a difficult latch and ended up pumping for 5 weeks  Feeding Mother's Current Feeding Choice: Breast Milk  LATCH Score Latch: Repeated attempts needed to sustain latch, nipple held in mouth throughout feeding, stimulation needed to elicit sucking reflex.  Audible Swallowing:  Spontaneous and intermittent  Type of Nipple: Everted at rest and after stimulation (areola edema, reverse pressure helped to compress the areola enough to latch the baby comfortablely with depth)  Comfort (Breast/Nipple): Soft / non-tender  Hold (Positioning): Assistance needed to correctly position infant at breast and maintain latch.  LATCH Score: 8   Lactation Tools Discussed/Used Tools: Shells;Pump;Flanges;Coconut oil;Comfort gels Flange Size: 21;24 Breast pump type: Manual Pump Education: Setup, frequency, and cleaning;Milk Storage  Interventions Interventions: Breast feeding basics reviewed;Assisted with latch;Skin to skin;Breast massage;Hand express;Reverse pressure;Breast compression;Adjust position;Support pillows;Position options;Shells;Coconut oil;Comfort gels;Hand pump;Education;LC Services brochure  Discharge Pump: Personal;Hands Free;DEBP;Manual WIC Program: No  Consult Status Consult Status: Follow-up Date: 04/06/22 Follow-up type: In-patient    O'Brien 04/05/2022, 5:49 PM

## 2022-04-05 NOTE — Lactation Note (Signed)
This note was copied from a baby's chart. Lactation Consultation Note  Patient Name: Rachel Logan TOIZT'I Date: 04/05/2022 Age : 28 hours old  Reason for consult: Follow-up assessment (per mom baby last fed at 9 am for 7 mins. Per mom would like to nap. LC recommended and enc mom to call with feeding cues for Latch assessment.) LC updated the doc flow sheets per mom .  According to the doc flow sheets baby has breast fed x 6 since birth.  Maternal Data    Feeding Mother's Current Feeding Choice: Breast Milk  LATCH Score - Latch scores 8-5    Consult Status Consult Status: Follow-up Date: 04/05/22 Follow-up type: In-patient    [place] 04/05/2022, 10:38 AM

## 2022-04-06 LAB — RH IG WORKUP (INCLUDES ABO/RH)
Fetal Screen: NEGATIVE
Gestational Age(Wks): 39
Unit division: 0

## 2022-04-06 MED ORDER — BENZOCAINE-MENTHOL 20-0.5 % EX AERO: 1.0000 | INHALATION_SPRAY | CUTANEOUS | Status: DC | PRN

## 2022-04-06 MED ORDER — IBUPROFEN 600 MG PO TABS: 600.0000 mg | ORAL_TABLET | Freq: Four times a day (QID) | ORAL | 0 refills | Status: DC

## 2022-04-06 MED ORDER — ACETAMINOPHEN 325 MG PO TABS: 650.0000 mg | ORAL_TABLET | ORAL | Status: DC | PRN

## 2022-04-06 MED ORDER — COCONUT OIL OIL: 1.0000 | TOPICAL_OIL | 0 refills | Status: DC | PRN

## 2022-04-06 NOTE — Discharge Instructions (Signed)
Lactation outpatient support - home visit   Jessica Bowers, IBCLC (lactation consultant)  & Birth Doula  Phone (text or call): [phone] Email: [email or web address]   Linda Coppola RN, MHA, IBCLC at [facility]: Lactation Consultant  [email or web address] Mail: [email or web address] Tel: [phone]   Additional breastfeeding resources:  [facility] [email or web address]://ibconline.ca/information-[email or web address]  [employer] [facility] [place]  [email or web address]   Other Resources:  Chiropractic specialist   Dr. Leanna Hastings [email or web address]   Craniosacral therapy for baby  Erin Balkind  [email or web address]  Pediatric Dentist (for tongue ties)  Dr. Kate Lambert Spangler, [facility]  Phone: [phone]  [address] [place] [address] [email or web address]  

## 2022-04-06 NOTE — Discharge Summary (Signed)
OB Discharge Summary  Patient Name: Rachel Logan DOB: 06-13-1995 MRN: [id number]  Date of admission: 04/04/2022 Delivering provider: Gavin Potters K   Admitting diagnosis: Normal labor [O80, Z37.9] Intrauterine pregnancy: [redacted]w[redacted]d    Secondary diagnosis: Patient Active Problem List   Diagnosis Date Noted   RhD negative/Baby Rh positive 04/05/2022   Normal labor 04/04/2022   SVD (spontaneous vaginal delivery) 04/04/2022   First degree perineal laceration 04/04/2022   Postpartum care following vaginal delivery 1/29 04/04/2022   Anxiety and depression 01/18/2017   Additional problems:nipple pain, APNO sent to pharmacy at discharge, lactation home visit scheduled for Friday.    Date of discharge: 04/06/2022   Discharge diagnosis: Principal Problem:   Postpartum care following vaginal delivery 1/29 Active Problems:   Anxiety and depression   Normal labor   SVD (spontaneous vaginal delivery)   First degree perineal laceration   RhD negative/Baby Rh positive                                                              Post partum procedures: Rhogam  Augmentation: AROM Pain control: Local  Laceration:1st degree;Perineal  Episiotomy:None  Complications: None  Hospital course:  Onset of Labor With Vaginal Delivery      27y.o. yo G3P2002 at 368w3das admitted in Latent Labor on 04/04/2022. Labor course was complicated by none.   Membrane Rupture Time/Date: 7:44 PM ,04/04/2022   Delivery Method:Vaginal, Spontaneous  Episiotomy: None  Lacerations:  1st degree;Perineal  Patient had a postpartum course complicated by none.  She is ambulating, tolerating a regular diet, passing flatus, and urinating well. Patient is discharged home in stable condition on 04/06/22.  Newborn Data: Birth date:04/04/2022  Birth time:9:27 PM  Gender:Female  Living status:Living  Apgars:8 ,9  Weight:4309 g   Subjective: Doing well, working on latch, having nipple pain and some bruising. [place] will see peds tomorrow.  APNO and nipple shield on hand for now.  Desires DC home today.  Physical exam  Vitals:   04/05/22 0459 04/05/22 0845 04/05/22 2103 04/06/22 0531  BP: 115/81 107/73 113/81 115/79  Pulse: 72 80 99 86  Resp: 15 18 19 18  $ Temp: 98.3 F (36.8 C) 98.4 F (36.9 C) 98 F (36.7 C) 98.6 F (37 C)  TempSrc:  Oral Oral Oral  SpO2: 100% 99% 99% 100%  Weight:      Height:       General: alert, cooperative, and no distress Lochia: appropriate Uterine Fundus: firm Incision: N/A Perineum: repair intact, no edema DVT Evaluation: No cords or calf tenderness. No significant calf/ankle edema. Labs: Lab Results  Component Value Date   WBC 17.8 (H) 04/05/2022   HGB 10.8 (L) 04/05/2022   HCT 31.4 (L) 04/05/2022   MCV 84.4 04/05/2022   PLT 162 04/05/2022      Latest Ref Rng & Units 05/11/2021    1:35 PM  CMP  Glucose 70 - 99 mg/dL 92   BUN 6 - 20 mg/dL 16   Creatinine 0.57 - 1.00 mg/dL 0.81   Sodium 134 - 144 mmol/L 139   Potassium 3.5 - 5.2 mmol/L 4.3   Chloride 96 - 106 mmol/L 101   CO2 20 - 29 mmol/L 23  Calcium 8.7 - 10.2 mg/dL 9.6   Total Protein 6.0 - 8.5 g/dL 7.7   Total Bilirubin 0.0 - 1.2 mg/dL 0.6   Alkaline Phos 44 - 121 IU/L 90   AST 0 - 40 IU/L 16   ALT 0 - 32 IU/L 14       04/05/2022    5:48 PM 10/13/2019    7:35 AM  Edinburgh Postnatal Depression Scale Screening Tool  I have been able to laugh and see the funny side of things. 0 0  I have looked forward with enjoyment to things. 0 0  I have blamed myself unnecessarily when things went wrong. 1 2  I have been anxious or worried for no good reason. 0 1  I have felt scared or panicky for no good reason. 0 0  Things have been getting on top of me. 0 1  I have been so unhappy that I have had difficulty sleeping. 0 0  I have felt sad or miserable. 0 1  I have been so unhappy that I have been crying. 0 1  The thought of harming myself has occurred to me. 0 0   Edinburgh Postnatal Depression Scale Total 1 6   Discharge instruction:  per After Visit Summary,  [facility] OB booklet and  "Understanding Mother & Baby Care" hospital booklet After Visit Meds:  Allergies as of 04/06/2022       Reactions   Bee Venom    Penicillins         Medication List     TAKE these medications    acetaminophen 325 MG tablet Commonly known as: Tylenol Take 2 tablets (650 mg total) by mouth every 4 (four) hours as needed (for pain scale < 4).   benzocaine-Menthol 20-0.5 % Aero Commonly known as: DERMOPLAST Apply 1 Application topically as needed for irritation (perineal discomfort).   coconut oil Oil Apply 1 Application topically as needed.   ibuprofen 600 MG tablet Commonly known as: ADVIL Take 1 tablet (600 mg total) by mouth every 6 (six) hours.   PRENATAL COMPLETE PO Take by mouth.               Discharge Care Instructions  (From admission, onward)           Start     Ordered   04/06/22 0000  Discharge wound care:       Comments: Sitz baths 2 times /day with warm water x 1 week. May add herbals: 1 ounce dried comfrey leaf* 1 ounce calendula flowers 1 ounce lavender flowers  Supplies can be found online at [employer] sources at [employer], [employer]  1/2 ounce dried uva ursi leaves 1/2 ounce witch hazel blossoms (if you can find them) 1/2 ounce dried sage leaf 1/2 cup sea salt Directions: Bring 2 quarts of water to a boil. Turn off heat, and place 1 ounce (approximately 1 large handful) of the above mixed herbs (not the salt) into the pot. Steep, covered, for 30 minutes.  Strain the liquid well with a fine mesh strainer, and discard the herb material. Add 2 quarts of liquid to the tub, along with the 1/2 cup of salt. This medicinal liquid can also be made into compresses and peri-rinses.   04/06/22 1032           Diet: routine diet Activity: Advance as tolerated. Pelvic rest for 6 weeks.  Postpartum  contraception: TBA in office Newborn Data: Live born female  Birth Weight:  9 lb 8 oz (4309 g) APGAR: 8, 9  Newborn Delivery   Birth date/time: 04/04/2022 21:27:00 Delivery type: Vaginal, Spontaneous      named Lanelle Bal Baby Feeding: Breast Disposition:home with mother Delivery Report: Review the Delivery Report for details.   Follow up:  Follow-up Information     Suzan Nailer, CNM. Schedule an appointment as soon as possible for a visit in 6 week(s).   Specialty: Certified Nurse Midwife Why: For Postpartum follow-up Contact information: [place] [address] [phone]                Signed: Otilio Carpen, MSN 04/06/2022, 10:33 AM

## 2022-04-06 NOTE — Lactation Note (Addendum)
This note was copied from a baby's chart. Lactation Consultation Note  Patient Name: Rachel Logan ALPFX'T Date: 04/06/2022 Reason for consult: Term;Breastfeeding assistance;Follow-up assessment Age:28 hours  P2, 6% weight loss, sore nipples, history of low milk supply and difficult latch  Mother's nipples are red and tender. She is very motivated to breast feed. She has a Science writer that she will be working with following discharge with her first appointment on 04/08/2022.   Assisted mother with positioning and attachment. Baby latched well with this feeding. Mother had initial pain with latch however discomfort decreased as feeding progressed. Instructed mother to observe baby at breast and keep baby engaged with alternate breast massage. Discussed signs of milk transfer. Colostrum was not observed with hand expression prior to latch and swallowing was occasional. Mother reports baby has been content after getting expressed milk by bottle. Mother has a history of low milk supply and pumping initiated for stimulation and supplementation.   Mother will continue with management of sore nipples and will supplement if unable to latch baby and milk volume not present.   CNM has made rounds and is ordering All Purpose Nipple cream for mother's nipple soreness. Requested mother is given a nipple shield. Taught application of nipple shield and instructed to observe for milk transfer     Feeding Mother's Current Feeding Choice: Breast Milk and Donor Milk Nipple Type: Slow - flow  LATCH Score Latch: Grasps breast easily, tongue down, lips flanged, rhythmical sucking.  Audible Swallowing: A few with stimulation  Type of Nipple: Everted at rest and after stimulation  Comfort (Breast/Nipple): Filling, red/small blisters or bruises, mild/mod discomfort  Hold (Positioning): Assistance needed to correctly position infant at breast and maintain latch.  LATCH Score: 7   Lactation Tools  Discussed/Used Tools: Pump;Flanges Flange Size: 21 Breast pump type: Double-Electric Breast Pump Pump Education: Setup, frequency, and cleaning;Milk Storage Reason for Pumping: sore nipples  Interventions Interventions: Breast feeding basics reviewed;Assisted with latch;Skin to skin;Breast massage;Hand express;Breast compression;Adjust position;Support pillows;Position options;Coconut oil;Comfort gels;DEBP;Education;LC Services brochure  Discharge Discharge Education: Engorgement and breast care;Warning signs for feeding baby Pump: DEBP;Personal  Consult Status Consult Status: Complete    Gwenevere Abbot 04/06/2022, 10:42 AM

## 2022-04-06 NOTE — Lactation Note (Signed)
This note was copied from a baby's chart. Lactation Consultation Note  Patient Name: Rachel Logan IOMBT'D Date: 04/06/2022 Reason for consult: Mother's request;Nipple pain/trauma Age:28 hours Mom called d/t baby wanting to cluster feed but had cried for past 3 hrs. Baby is gassy. Mom stated baby would latch and feed a few minutes then pop off crying. Mom's nipples are red and raw, burning and hurting. Noted when [place] assisted in latching, having difficulty flanging tops lip. Baby does have a thick labial frenulum to bottom of top gum line. Assisted in football hold but mom unable to tolerate pain.  Have seen baby extend her tongue out to lips. No heart shape tongue noted. Baby did leak some from corner of mouth or made clicking sound on the bottle w/purple nipple. Repositioned bottle and baby made it better. Discussed paced feeding.   Maternal Data Has patient been taught Hand Expression?: Yes  Feeding Mother's Current Feeding Choice: Breast Milk and Donor Milk Nipple Type: Nfant Slow Flow (purple)  LATCH Score Latch: Grasps breast easily, tongue down, lips flanged, rhythmical sucking.  Audible Swallowing: A few with stimulation  Type of Nipple: Everted at rest and after stimulation  Comfort (Breast/Nipple): Filling, red/small blisters or bruises, mild/mod discomfort (nipples hurting/red)  Hold (Positioning): Assistance needed to correctly position infant at breast and maintain latch.  LATCH Score: 7   Lactation Tools Discussed/Used Tools: Pump;Shells;Coconut oil;Comfort gels  Interventions Interventions: Breast feeding basics reviewed;Adjust position;Assisted with latch;Support pillows;Skin to skin;Position options;Breast massage;Hand express;Shells;Breast compression;Coconut oil;Comfort gels  Discharge    Consult Status Consult Status: Follow-up Date: 04/06/22 (afternoon) Follow-up type: In-patient    Allisen Pidgeon, Elta [place] 04/06/2022, 3:13 AM

## 2022-04-15 ENCOUNTER — Telehealth (HOSPITAL_COMMUNITY): Payer: Self-pay | Admitting: *Deleted

## 2022-04-15 NOTE — Telephone Encounter (Signed)
Voice mailbox is full. Unable to leave message.   Odis Hollingshead, RN 04-15-2022 at 1:57pm

## 2022-05-26 DIAGNOSIS — O923 Agalactia: Secondary | ICD-10-CM | POA: Diagnosis not present

## 2022-05-31 DIAGNOSIS — Z3483 Encounter for supervision of other normal pregnancy, third trimester: Secondary | ICD-10-CM | POA: Diagnosis not present

## 2022-05-31 DIAGNOSIS — Z3482 Encounter for supervision of other normal pregnancy, second trimester: Secondary | ICD-10-CM | POA: Diagnosis not present

## 2022-06-13 DIAGNOSIS — O925 Suppressed lactation: Secondary | ICD-10-CM | POA: Diagnosis not present

## 2022-06-14 DIAGNOSIS — O925 Suppressed lactation: Secondary | ICD-10-CM | POA: Diagnosis not present

## 2022-10-13 ENCOUNTER — Telehealth: Payer: Self-pay | Admitting: Family Medicine

## 2022-10-13 NOTE — Telephone Encounter (Signed)
Patient has physical on 8/26 needing labs.

## 2022-10-14 ENCOUNTER — Encounter: Payer: Self-pay | Admitting: Nurse Practitioner

## 2022-10-14 ENCOUNTER — Other Ambulatory Visit: Payer: Self-pay | Admitting: Nurse Practitioner

## 2022-10-14 DIAGNOSIS — Z1329 Encounter for screening for other suspected endocrine disorder: Secondary | ICD-10-CM

## 2022-10-14 DIAGNOSIS — Z130 Encounter for screening for diseases of the blood and blood-forming organs and certain disorders involving the immune mechanism: Secondary | ICD-10-CM

## 2022-10-14 DIAGNOSIS — Z13228 Encounter for screening for other metabolic disorders: Secondary | ICD-10-CM

## 2022-10-14 DIAGNOSIS — Z0000 Encounter for general adult medical examination without abnormal findings: Secondary | ICD-10-CM

## 2022-10-14 DIAGNOSIS — Z13220 Encounter for screening for lipoid disorders: Secondary | ICD-10-CM

## 2022-10-14 NOTE — Telephone Encounter (Signed)
Labs ordered. Sent message through my chart.

## 2022-10-26 DIAGNOSIS — Z1329 Encounter for screening for other suspected endocrine disorder: Secondary | ICD-10-CM | POA: Diagnosis not present

## 2022-10-26 DIAGNOSIS — Z130 Encounter for screening for diseases of the blood and blood-forming organs and certain disorders involving the immune mechanism: Secondary | ICD-10-CM | POA: Diagnosis not present

## 2022-10-26 DIAGNOSIS — Z13220 Encounter for screening for lipoid disorders: Secondary | ICD-10-CM | POA: Diagnosis not present

## 2022-10-26 DIAGNOSIS — Z13228 Encounter for screening for other metabolic disorders: Secondary | ICD-10-CM | POA: Diagnosis not present

## 2022-10-26 DIAGNOSIS — Z0000 Encounter for general adult medical examination without abnormal findings: Secondary | ICD-10-CM | POA: Diagnosis not present

## 2022-10-27 LAB — COMPREHENSIVE METABOLIC PANEL
Chloride: 102 mmol/L (ref 96–106)
Globulin, Total: 2.5 g/dL (ref 1.5–4.5)

## 2022-10-27 LAB — CBC WITH DIFFERENTIAL/PLATELET: WBC: 6.3 10*3/uL (ref 3.4–10.8)

## 2022-10-31 ENCOUNTER — Ambulatory Visit (INDEPENDENT_AMBULATORY_CARE_PROVIDER_SITE_OTHER): Payer: 59 | Admitting: Nurse Practitioner

## 2022-10-31 VITALS — BP 111/78 | HR 85 | Temp 98.1°F | Ht 67.0 in | Wt 193.8 lb

## 2022-10-31 DIAGNOSIS — Z01419 Encounter for gynecological examination (general) (routine) without abnormal findings: Secondary | ICD-10-CM | POA: Diagnosis not present

## 2022-10-31 DIAGNOSIS — Z1151 Encounter for screening for human papillomavirus (HPV): Secondary | ICD-10-CM

## 2022-10-31 DIAGNOSIS — Z124 Encounter for screening for malignant neoplasm of cervix: Secondary | ICD-10-CM | POA: Diagnosis not present

## 2022-10-31 NOTE — Progress Notes (Unsigned)
Subjective:    Patient ID: Rachel Logan, female    DOB: [birth year], 27 y.o.   MRN: [id number]  HPI The patient comes in today for a wellness visit.    A review of their health history was completed.  A review of medications was also completed.  Any needed refills; No  Eating habits: Good  Falls/  MVA accidents in past few months: No  Regular exercise: Limited due to work and family; also working out seems to decrease her breast milk supply  Specialist pt sees on regular basis: No  Preventative health issues were discussed.   Additional concerns: No Married, same female sexual partner; using rhythm method and watching ovulation tests for contraception.  No cycle since pregnancy; breastfeeding Has had eye exam; due for dental exam  Review of Systems  Constitutional:  Negative for activity change, appetite change and fatigue.  HENT:  Negative for sore throat and trouble swallowing.   Respiratory:  Negative for cough, chest tightness, shortness of breath and wheezing.   Cardiovascular:  Negative for chest pain.  Gastrointestinal:  Negative for abdominal distention, abdominal pain, constipation, diarrhea, nausea and vomiting.  Genitourinary:  Negative for difficulty urinating, dysuria, enuresis, frequency, genital sores, menstrual problem, pelvic pain, urgency, vaginal bleeding and vaginal discharge.      10/31/2022   11:09 AM  Depression screen PHQ 2/9  Decreased Interest 0  Down, Depressed, Hopeless 0  PHQ - 2 Score 0  Altered sleeping 0  Change in appetite 1  Feeling bad or failure about yourself  0  Trouble concentrating 0  Moving slowly or fidgety/restless 1  Suicidal thoughts 0  PHQ-9 Score 2  Difficult doing work/chores Not difficult at all      10/31/2022   11:09 AM 09/29/2017    8:42 AM 08/28/2017    9:40 AM 03/30/2017    8:23 PM  GAD 7 : Generalized Anxiety Score  Nervous, Anxious, on Edge 1 2 2 3   Control/stop worrying 1 2 1 3   Worry too much -  different things 1 3 1 3   Trouble relaxing 0 2 1 3   Restless 0 3 1 3   Easily annoyed or irritable 2 1 2 3   Afraid - awful might happen 0 0 1 3  Total GAD 7 Score 5 13 9 21   Anxiety Difficulty Not difficult at all Not difficult at all Not difficult at all Somewhat difficult         Objective:   Physical Exam Vitals and nursing note reviewed. Chaperone present: Defers chaperone.  Constitutional:      General: She is not in acute distress.    Appearance: She is well-developed.  Neck:     Thyroid: No thyromegaly.     Trachea: No tracheal deviation.     Comments: Thyroid non tender to palpation. No mass or goiter noted.  Cardiovascular:     Rate and Rhythm: Normal rate and regular rhythm.     Heart sounds: Normal heart sounds. No murmur heard. Pulmonary:     Effort: Pulmonary effort is normal.     Breath sounds: Normal breath sounds.  Chest:  Breasts:    Right: No swelling, inverted nipple, mass, skin change or tenderness.     Left: No swelling, inverted nipple, mass, skin change or tenderness.  Abdominal:     General: There is no distension.     Palpations: Abdomen is soft.     Tenderness: There is no abdominal tenderness.  Genitourinary:  General: Normal vulva.     Labia:        Right: No rash, tenderness or lesion.        Left: No rash, tenderness or lesion.      Vagina: No vaginal discharge, erythema, bleeding or lesions.     Cervix: Cervical motion tenderness present. No discharge, lesion or erythema.     Comments: Bimanual exam: no tenderness or obvious masses Musculoskeletal:     Cervical back: Normal range of motion and neck supple.  Lymphadenopathy:     Cervical: No cervical adenopathy.     Upper Body:     Right upper body: No supraclavicular, axillary or pectoral adenopathy.     Left upper body: No supraclavicular, axillary or pectoral adenopathy.  Skin:    General: Skin is warm and dry.  Neurological:     Mental Status: She is alert and oriented to  person, place, and time.  Psychiatric:        Mood and Affect: Mood normal.        Behavior: Behavior normal.        Thought Content: Thought content normal.        Judgment: Judgment normal.    Today's Vitals   10/31/22 1101  BP: 111/78  Pulse: 85  Temp: 98.1 F (36.7 C)  SpO2: 98%  Weight: 193 lb 12.8 oz (87.9 kg)  Height: 5\' 7"  (1.702 m)   Body mass index is 30.35 kg/m. Results for orders placed or performed in visit on 10/14/22  CBC with Differential/Platelet  Result Value Ref Range   WBC 6.3 3.4 - 10.8 x10E3/uL   RBC 4.36 3.77 - 5.28 x10E6/uL   Hemoglobin 12.0 11.1 - 15.9 g/dL   Hematocrit 16.1 09.6 - 46.6 %   MCV 83 79 - 97 fL   MCH 27.5 26.6 - 33.0 pg   MCHC 33.1 31.5 - 35.7 g/dL   RDW 04.5 40.9 - 81.1 %   Platelets 217 150 - 450 x10E3/uL   Neutrophils 52 Not Estab. %   Lymphs 35 Not Estab. %   Monocytes 9 Not Estab. %   Eos 3 Not Estab. %   Basos 1 Not Estab. %   Neutrophils Absolute 3.2 1.4 - 7.0 x10E3/uL   Lymphocytes Absolute 2.2 0.7 - 3.1 x10E3/uL   Monocytes Absolute 0.6 0.1 - 0.9 x10E3/uL   EOS (ABSOLUTE) 0.2 0.0 - 0.4 x10E3/uL   Basophils Absolute 0.0 0.0 - 0.2 x10E3/uL   Immature Granulocytes 0 Not Estab. %   Immature Grans (Abs) 0.0 0.0 - 0.1 x10E3/uL  Comprehensive metabolic panel  Result Value Ref Range   Glucose 83 70 - 99 mg/dL   BUN 17 6 - 20 mg/dL   Creatinine, Ser 9.14 0.57 - 1.00 mg/dL   eGFR 782 >95 AO/ZHY/8.65   BUN/Creatinine Ratio 26 (H) 9 - 23   Sodium 140 134 - 144 mmol/L   Potassium 4.3 3.5 - 5.2 mmol/L   Chloride 102 96 - 106 mmol/L   CO2 24 20 - 29 mmol/L   Calcium 9.5 8.7 - 10.2 mg/dL   Total Protein 7.0 6.0 - 8.5 g/dL   Albumin 4.5 4.0 - 5.0 g/dL   Globulin, Total 2.5 1.5 - 4.5 g/dL   Bilirubin Total <7.8 0.0 - 1.2 mg/dL   Alkaline Phosphatase 120 44 - 121 IU/L   AST 16 0 - 40 IU/L   ALT 16 0 - 32 IU/L  Lipid panel  Result Value Ref Range   Cholesterol,  Total 170 100 - 199 mg/dL   Triglycerides 93 0 - 149 mg/dL    HDL 50 >82 mg/dL   VLDL Cholesterol Cal 17 5 - 40 mg/dL   LDL Chol Calc (NIH) 423 (H) 0 - 99 mg/dL   Chol/HDL Ratio 3.4 0.0 - 4.4 ratio  TSH  Result Value Ref Range   TSH 1.340 0.450 - 4.500 uIU/mL   Reviewed labs with patient during visit.         Assessment & Plan:  Well woman exam - Plan: IGP, Aptima HPV  Screening for cervical cancer - Plan: IGP, Aptima HPV  Screening for HPV (human papillomavirus) - Plan: IGP, Aptima HPV Encouraged healthy diet and regular activity. Defers contraceptives at this time. Recommend flu vaccine this fall.  PAP results pending.  Return in about 1 year (around 10/31/2023) for physical.

## 2022-11-03 ENCOUNTER — Encounter: Payer: Self-pay | Admitting: Nurse Practitioner

## 2022-11-05 LAB — IGP, APTIMA HPV: HPV Aptima: NEGATIVE

## 2023-03-31 DIAGNOSIS — N819 Female genital prolapse, unspecified: Secondary | ICD-10-CM | POA: Diagnosis not present

## 2023-09-22 ENCOUNTER — Encounter: Payer: Self-pay | Admitting: Advanced Practice Midwife

## 2023-10-29 ENCOUNTER — Encounter: Payer: Self-pay | Admitting: Family Medicine

## 2023-10-31 ENCOUNTER — Other Ambulatory Visit: Payer: Self-pay

## 2023-10-31 DIAGNOSIS — Z13228 Encounter for screening for other metabolic disorders: Secondary | ICD-10-CM | POA: Diagnosis not present

## 2023-10-31 DIAGNOSIS — Z13220 Encounter for screening for lipoid disorders: Secondary | ICD-10-CM

## 2023-10-31 DIAGNOSIS — Z130 Encounter for screening for diseases of the blood and blood-forming organs and certain disorders involving the immune mechanism: Secondary | ICD-10-CM | POA: Diagnosis not present

## 2023-11-01 ENCOUNTER — Ambulatory Visit: Payer: Self-pay | Admitting: Family Medicine

## 2023-11-01 LAB — COMPREHENSIVE METABOLIC PANEL WITH GFR
ALT: 14 IU/L (ref 0–32)
AST: 11 IU/L (ref 0–40)
Albumin: 4.7 g/dL (ref 4.0–5.0)
Alkaline Phosphatase: 119 IU/L (ref 44–121)
BUN/Creatinine Ratio: 31 — ABNORMAL HIGH (ref 9–23)
BUN: 24 mg/dL — ABNORMAL HIGH (ref 6–20)
Bilirubin Total: 0.3 mg/dL (ref 0.0–1.2)
CO2: 21 mmol/L (ref 20–29)
Calcium: 9.5 mg/dL (ref 8.7–10.2)
Chloride: 101 mmol/L (ref 96–106)
Creatinine, Ser: 0.77 mg/dL (ref 0.57–1.00)
Globulin, Total: 2.7 g/dL (ref 1.5–4.5)
Glucose: 91 mg/dL (ref 70–99)
Potassium: 4.6 mmol/L (ref 3.5–5.2)
Sodium: 137 mmol/L (ref 134–144)
Total Protein: 7.4 g/dL (ref 6.0–8.5)
eGFR: 107 mL/min/1.73 (ref >59)

## 2023-11-01 LAB — CBC WITH DIFFERENTIAL/PLATELET
Basophils Absolute: 0.1 x10E3/uL (ref 0.0–0.2)
Basos: 1 %
EOS (ABSOLUTE): 0.1 x10E3/uL (ref 0.0–0.4)
Eos: 2 %
Hematocrit: 41.5 % (ref 34.0–46.6)
Hemoglobin: 13.3 g/dL (ref 11.1–15.9)
Immature Grans (Abs): 0 x10E3/uL (ref 0.0–0.1)
Immature Granulocytes: 0 %
Lymphocytes Absolute: 1.8 x10E3/uL (ref 0.7–3.1)
Lymphs: 28 %
MCH: 27.8 pg (ref 26.6–33.0)
MCHC: 32 g/dL (ref 31.5–35.7)
MCV: 87 fL (ref 79–97)
Monocytes Absolute: 0.5 x10E3/uL (ref 0.1–0.9)
Monocytes: 7 %
Neutrophils Absolute: 4.2 x10E3/uL (ref 1.4–7.0)
Neutrophils: 62 %
Platelets: 232 x10E3/uL (ref 150–450)
RBC: 4.79 x10E6/uL (ref 3.77–5.28)
RDW: 11.9 % (ref 11.7–15.4)
WBC: 6.6 x10E3/uL (ref 3.4–10.8)

## 2023-11-01 LAB — LIPID PANEL
Chol/HDL Ratio: 3.6 ratio (ref 0.0–4.4)
Cholesterol, Total: 160 mg/dL (ref 100–199)
HDL: 45 mg/dL (ref >39)
LDL Chol Calc (NIH): 99 mg/dL (ref 0–99)
Triglycerides: 83 mg/dL (ref 0–149)
VLDL Cholesterol Cal: 16 mg/dL (ref 5–40)

## 2023-11-02 ENCOUNTER — Ambulatory Visit (INDEPENDENT_AMBULATORY_CARE_PROVIDER_SITE_OTHER): Admitting: Family Medicine

## 2023-11-02 ENCOUNTER — Encounter: Payer: Self-pay | Admitting: Family Medicine

## 2023-11-02 VITALS — BP 99/70 | HR 87 | Temp 98.2°F | Ht 67.0 in | Wt 176.0 lb

## 2023-11-02 DIAGNOSIS — Z0000 Encounter for general adult medical examination without abnormal findings: Secondary | ICD-10-CM | POA: Diagnosis not present

## 2023-11-02 NOTE — Patient Instructions (Signed)
Follow up annually. ? ?Take care ? ?Dr. Kivon Aprea  ?

## 2023-11-06 DIAGNOSIS — Z0000 Encounter for general adult medical examination without abnormal findings: Secondary | ICD-10-CM | POA: Insufficient documentation

## 2023-11-06 NOTE — Assessment & Plan Note (Signed)
 Doing well. Advised to get annual flu vaccine.  Follow up annually.

## 2023-11-06 NOTE — Progress Notes (Signed)
 Subjective:  Patient ID: Rachel Logan, female    DOB: 07-23-1995  Age: 28 y.o. MRN: [id number]  CC:   Chief Complaint  Patient presents with   Annual Exam    HPI:  28 year old female presents for an annual physical exam.  Patient reports that she is doing well.  Denies chest pain of breath.  She is busy with her 2 children.  Exercises 2-3 times a week.  Annual eye exam up-to-date.  Flu shot recommended but we do not yet have these in stock.  The rest of her preventative health care is up-to-date.   Social Hx   Social History   Socioeconomic History   Marital status: Married    Spouse name: Enola   Number of children: Not on file   Years of education: Not on file   Highest education level: Not on file  Occupational History   Not on file  Tobacco Use   Smoking status: Never   Smokeless tobacco: Never  Vaping Use   Vaping status: Never Used  Substance and Sexual Activity   Alcohol use: No   Drug use: No   Sexual activity: Yes    Birth control/protection: Rhythm  Other Topics Concern   Not on file  Social History Narrative   Not on file   Social Drivers of Health   Financial Resource Strain: Not on file  Food Insecurity: Unknown (04/04/2022)   Hunger Vital Sign    Worried About Running Out of Food in the Last Year: Never true    Ran Out of Food in the Last Year: Not on file  Transportation Needs: No Transportation Needs (04/04/2022)   PRAPARE - Administrator, Civil Service (Medical): No    Lack of Transportation (Non-Medical): No  Physical Activity: Not on file  Stress: Not on file  Social Connections: Not on file    Review of Systems  Constitutional: Negative.   Respiratory: Negative.    Cardiovascular: Negative.    Objective:  BP 99/70   Pulse 87   Temp 98.2 F (36.8 C)   Ht 5' 7 (1.702 m)   Wt 176 lb (79.8 kg)   SpO2 97%   BMI 27.57 kg/m      11/02/2023    1:57 PM 10/31/2022   11:01 AM 04/06/2022    5:31 AM  BP/Weight   Systolic BP 99 111 115  Diastolic BP 70 78 79  Wt. (Lbs) 176 193.8   BMI 27.57 kg/m2 30.35 kg/m2     Physical Exam Vitals and nursing note reviewed.  Constitutional:      General: She is not in acute distress.    Appearance: Normal appearance.  HENT:     Head: Normocephalic and atraumatic.  Eyes:     General:        Right eye: No discharge.        Left eye: No discharge.     Conjunctiva/sclera: Conjunctivae normal.  Cardiovascular:     Rate and Rhythm: Normal rate and regular rhythm.  Pulmonary:     Effort: Pulmonary effort is normal.     Breath sounds: Normal breath sounds. No wheezing, rhonchi or rales.  Neurological:     Mental Status: She is alert.  Psychiatric:        Mood and Affect: Mood normal.        Behavior: Behavior normal.     Lab Results  Component Value Date   WBC 6.6 10/31/2023   HGB 13.3  10/31/2023   HCT 41.5 10/31/2023   PLT 232 10/31/2023   GLUCOSE 91 10/31/2023   CHOL 160 10/31/2023   TRIG 83 10/31/2023   HDL 45 10/31/2023   LDLCALC 99 10/31/2023   ALT 14 10/31/2023   AST 11 10/31/2023   NA 137 10/31/2023   K 4.6 10/31/2023   CL 101 10/31/2023   CREATININE 0.77 10/31/2023   BUN 24 (H) 10/31/2023   CO2 21 10/31/2023   TSH 1.340 10/26/2022     Assessment & Plan:  Annual physical exam Assessment & Plan: Doing well. Advised to get annual flu vaccine.  Follow up annually.     Follow-up:  Annually   Evalyse Stroope DO [facility]
